# Patient Record
Sex: Male | Born: 1960 | Race: White | Hispanic: No | Marital: Married | Smoking: Never smoker
Health system: Southern US, Community
[De-identification: ages and names within clinical notes are randomized; demographics above are authoritative.]

## PROBLEM LIST (undated history)

## (undated) DIAGNOSIS — G4730 Sleep apnea, unspecified: Secondary | ICD-10-CM

## (undated) DIAGNOSIS — R748 Abnormal levels of other serum enzymes: Secondary | ICD-10-CM

## (undated) DIAGNOSIS — I10 Essential (primary) hypertension: Secondary | ICD-10-CM

## (undated) DIAGNOSIS — M1990 Unspecified osteoarthritis, unspecified site: Secondary | ICD-10-CM

## (undated) DIAGNOSIS — K219 Gastro-esophageal reflux disease without esophagitis: Secondary | ICD-10-CM

## (undated) DIAGNOSIS — K589 Irritable bowel syndrome without diarrhea: Secondary | ICD-10-CM

## (undated) DIAGNOSIS — M2241 Chondromalacia patellae, right knee: Secondary | ICD-10-CM

## (undated) DIAGNOSIS — E785 Hyperlipidemia, unspecified: Secondary | ICD-10-CM

## (undated) HISTORY — DX: Hyperlipidemia, unspecified: E78.5

## (undated) HISTORY — PX: TONSILLECTOMY AND ADENOIDECTOMY: SUR1326

## (undated) HISTORY — DX: Sleep apnea, unspecified: G47.30

## (undated) HISTORY — DX: Abnormal levels of other serum enzymes: R74.8

## (undated) HISTORY — DX: Irritable bowel syndrome, unspecified: K58.9

## (undated) HISTORY — PX: OTHER SURGICAL HISTORY: SHX169

## (undated) HISTORY — DX: Gastro-esophageal reflux disease without esophagitis: K21.9

---

## 2011-06-01 ENCOUNTER — Encounter: Payer: Self-pay | Admitting: Internal Medicine

## 2011-06-01 ENCOUNTER — Ambulatory Visit (INDEPENDENT_AMBULATORY_CARE_PROVIDER_SITE_OTHER): Payer: BC Managed Care – PPO | Admitting: Internal Medicine

## 2011-06-01 DIAGNOSIS — K589 Irritable bowel syndrome without diarrhea: Secondary | ICD-10-CM

## 2011-06-01 DIAGNOSIS — E785 Hyperlipidemia, unspecified: Secondary | ICD-10-CM | POA: Insufficient documentation

## 2011-06-01 NOTE — Patient Instructions (Addendum)
Stop Lipitor . Risk of premature heart attack or stroke increases as LDL or BAD cholesterol rises.Advanced cholesterol panels optimally determine risk based on particle composition ( NMR Lipoprofile ) or by assessing multiple other genetic risks([facility] Heart Panel 1304X). These are indicated when LDL is > 130, especially if there is family history of heart attack in males before 81 or women before 10  Please review Dr Gildardo Griffes book Eat, Drink & Be Healthy for dietary cholesterol information. Please  schedule fasting Labs  : NMR Lipoprofile Lipid Panel after 4 months of dietary changes to optimally assess LDL risk.   Please bring these instructions to that Lab appt.  Please take the probiotic , Align, every day until the bowels are normal. This will replace the normal bacteria which  are necessary for formation of normal stool and processing of food.

## 2011-06-01 NOTE — Progress Notes (Signed)
  Subjective:    Patient ID: Brendan Sharp, male    DOB: [birth year], 50 y.o.   MRN: [id number]  HPI  Mr Holloran  is here to establish primary care;acute issues  Include frequency of BMs since 03/12.      Review of Systems Dyslipidemia assessment: Prior Advanced Lipid Testing: no.   Family history of premature CAD/ MI: no .  Nutrition: no .  Exercise: 3X/ week . Diabetes : no . HTN: no. Smoking history  : never .   Weight :  stable. ROS: fatigue: no ; chest pain : no ;claudication: no; palpitations: no; abd pain/bowel changes: as above ; myalgias:no;  syncope : no ; memory loss: no;skin changes: no. Labs done 4 months ago @ annual       Objective:   Physical Exam Gen.: Thin but healthy and well-nourished in appearance. Alert, appropriate and cooperative throughout exam. Head: Normocephalic without obvious abnormalities Eyes: No corneal or conjunctival inflammation noted. Fundal exam is benign without hemorrhages, exudate, papilledema. Neck: No deformities, masses, or tenderness noted.  Thyroid normal. Lungs: Normal respiratory effort; chest expands symmetrically. Lungs are clear to auscultation without rales, wheezes, or increased work of breathing. Heart: Normal rate and rhythm. Normal S1 and S2. No gallop, click, or rub. S 4 w/o  murmur. Abdomen: Bowel sounds normal; abdomen soft and nontender. No masses, organomegaly or hernias noted.                                                                                   Musculoskeletal/extremities: No deformity or scoliosis noted of  the thoracic or lumbar spine. No clubbing, cyanosis, edema, or deformity noted. Nail health  good. Vascular: Carotid, radial artery, dorsalis pedis and  posterior tibial pulses are full and equal. No bruits present. Neurologic: Alert and oriented x3. Deep tendon reflexes symmetrical and normal.          Skin: Intact without suspicious lesions or rashes. Lymph: No cervical, axillary  lymphadenopathy  present. Psych: Mood and affect are normal. Normally interactive                                                                                        Assessment & Plan:  #1 dyslipidemia #2 possible IBS Plan: see Orders

## 2011-06-15 ENCOUNTER — Other Ambulatory Visit: Payer: Self-pay | Admitting: Internal Medicine

## 2011-06-15 ENCOUNTER — Telehealth: Payer: Self-pay | Admitting: Internal Medicine

## 2011-06-15 MED ORDER — ALIGN PO CAPS: 1.0000 | ORAL_CAPSULE | Freq: Every day | ORAL | Status: DC

## 2011-06-15 NOTE — Telephone Encounter (Signed)
RX printed and mailed 

## 2011-06-15 NOTE — Telephone Encounter (Signed)
Spoke with Pt and advised him that he is to take align, every day until the bowels are normal Per Dr hopper instruction. Also advise Pt the med is OTC so if he has used all samples he can get more med from local pharmacy. Pt ok and will call with any further concerns

## 2011-06-22 ENCOUNTER — Telehealth: Payer: Self-pay | Admitting: Internal Medicine

## 2011-06-22 NOTE — Telephone Encounter (Signed)
Patient needs rx for align - he wants to use flex spending - pleas mail to his home

## 2011-06-22 NOTE — Telephone Encounter (Signed)
Mailed on 06/15/11

## 2011-06-29 ENCOUNTER — Telehealth: Payer: Self-pay | Admitting: Internal Medicine

## 2011-06-29 MED ORDER — ALIGN PO CAPS: 1.0000 | ORAL_CAPSULE | Freq: Every day | ORAL | Status: DC

## 2011-06-29 NOTE — Telephone Encounter (Signed)
Patient said he didn't receive rx for align - i verified address which is correct - patient will pick up tomorrow

## 2011-06-29 NOTE — Telephone Encounter (Signed)
RX placed at the front for pick up

## 2011-07-20 ENCOUNTER — Telehealth: Payer: Self-pay | Admitting: Internal Medicine

## 2011-07-20 DIAGNOSIS — K589 Irritable bowel syndrome without diarrhea: Secondary | ICD-10-CM

## 2011-07-20 NOTE — Telephone Encounter (Signed)
If this supplement is not controlling the symptoms and they persist or progress; I recommended GI referral for optimal evaluation.

## 2011-07-21 NOTE — Telephone Encounter (Signed)
I recommend a GI consultation if no better with Align  to determine optimal therapy; please inform me if you have a physician preference.

## 2011-07-21 NOTE — Telephone Encounter (Signed)
Left message to call office

## 2011-07-21 NOTE — Telephone Encounter (Signed)
Addended by: Candie Echevaria L on: 07/21/2011 04:53 PM   Modules accepted: Orders

## 2011-07-21 NOTE — Telephone Encounter (Signed)
Pt aware- Referral put in--

## 2011-07-23 ENCOUNTER — Encounter: Payer: Self-pay | Admitting: Gastroenterology

## 2011-08-17 ENCOUNTER — Encounter: Payer: Self-pay | Admitting: Gastroenterology

## 2011-08-17 ENCOUNTER — Ambulatory Visit (INDEPENDENT_AMBULATORY_CARE_PROVIDER_SITE_OTHER): Payer: BC Managed Care – PPO | Admitting: Gastroenterology

## 2011-08-17 VITALS — BP 132/74 | HR 68 | Ht 70.0 in | Wt 184.0 lb

## 2011-08-17 DIAGNOSIS — R197 Diarrhea, unspecified: Secondary | ICD-10-CM | POA: Insufficient documentation

## 2011-08-17 DIAGNOSIS — Z8719 Personal history of other diseases of the digestive system: Secondary | ICD-10-CM

## 2011-08-17 DIAGNOSIS — K589 Irritable bowel syndrome without diarrhea: Secondary | ICD-10-CM

## 2011-08-17 MED ORDER — PEG-KCL-NACL-NASULF-NA ASC-C 100 G PO SOLR: 1.0000 | Freq: Once | ORAL | Status: DC

## 2011-08-17 NOTE — Progress Notes (Signed)
History of Present Illness:  This is a very nice 50 year old Caucasian male patient of Dr. Marga Melnick. He is referred today for evaluation of change in stool pattern over the last several months with frequent small volume stooling in the a.m. without melena or hematochezia. A trial of dicyclomine was unsuccessful. Also has recently discontinued Lipitor. He follows a regular diet and denies any specific food intolerances, and there is no family history of colon cancer or celiac disease. Review of labs show a normal CBC and metabolic profile, PSA level, had negative stool cards for occult blood. The patient has not had previous barium studies or colonoscopy exams. He follows a somewhat erratic diet, but denies use of sorbitol or fructose. He does occasionally have some crampy lower bowel pain, gas and bloating. He also has periodic GERD for which he uses over-the-counter Pepcid with good response. He denies dysphagia or any hepatobiliary or systemic complaints. He specifically denies Raynaud's phenomenon.  I have reviewed this patient's present history, medical and surgical past history, allergies and medications.     ROS: The remainder of the 10 point ROS is negative     Physical Exam: General well developed well nourished patient in no acute distress, appearing his stated age Eyes PERRLA, no icterus, fundoscopic exam per opthamologist Skin no lesions noted Neck supple, no adenopathy, no thyroid enlargement, no tenderness Chest clear to percussion and auscultation Heart no significant murmurs, gallops or rubs noted Abdomen no hepatosplenomegaly masses or tenderness, BS normal.  Extremities no acute joint lesions, edema, phlebitis or evidence of cellulitis. Neurologic patient oriented x 3, cranial nerves intact, no focal neurologic deficits noted. Psychological mental status normal and normal affect.  Assessment and plan: Probable diverticulosis coli with incomplete rectal emptying. I placed  him on a high fiber diet with daily Metamucil and liberal by mouth fluids. Colonoscopy has been scheduled at his convenience. I have discontinued dicyclomine use for now. Do not think he needs endoscopy for his minor GERD problems. Encounter Diagnoses  Name Primary?  . Diarrhea Yes  . Irritable bowel syndrome (IBS)   . History of gastroesophageal reflux (GERD)

## 2011-08-17 NOTE — Patient Instructions (Signed)
Your procedure has been scheduled for 09/25/2011, please follow the seperate instructions.  Handout on propofol given. Your prescription(s) have been sent to you pharmacy.  Follow the high fiber diet given. Today you watched the Diverticulitis movie in the office. Handout on diverticulitis given. Take Metamucil daily.  High Fiber Diet A high fiber diet changes your normal diet to include more whole grains, legumes, fruits, and vegetables. Changes in the diet involve replacing refined carbohydrates with unrefined foods. The calorie level of the diet is essentially unchanged. The Dietary Reference Intake (recommended amount) for adult males is 38 g per day. For adult females, it is 25 g per day. Pregnant and lactating women should consume 28 g of fiber per day. Fiber is the intact part of a plant that is not broken down during digestion. Functional fiber is fiber that has been isolated from the plant to provide a beneficial effect in the body. PURPOSE  Increase stool bulk.   Ease and regulate bowel movements.   Lower cholesterol.  INDICATIONS THAT YOU NEED MORE FIBER  Constipation and hemorrhoids.   Uncomplicated diverticulosis (intestine condition) and irritable bowel syndrome.   Weight management.   As a protective measure against hardening of the arteries (atherosclerosis), diabetes, and cancer.  NOTE OF CAUTION If you have a digestive or bowel problem, ask your caregiver for advice before adding high fiber foods to your diet. Some of the following medical problems are such that a high fiber diet should not be used without consulting your caregiver:  Acute diverticulitis (intestine infection).   Partial small bowel obstructions.   Complicated diverticular disease involving bleeding, rupture (perforation), or abscess (boil, furuncle).   Presence of autonomic neuropathy (nerve damage) or gastric paresis (stomach cannot empty itself).  GUIDELINES FOR INCREASING FIBER  Start adding  fiber to the diet slowly. A gradual increase of about 5 more grams (2 slices of whole-wheat bread, 2 servings of most fruits or vegetables, or 1 bowl of high fiber cereal) per day is best. Too rapid an increase in fiber may result in constipation, flatulence, and bloating.   Drink enough water and fluids to keep your urine clear or pale yellow. Water, juice, or caffeine-free drinks are recommended. Not drinking enough fluid may cause constipation.   Eat a variety of high fiber foods rather than one type of fiber.   Try to increase your intake of fiber through using high fiber foods rather than fiber pills or supplements that contain small amounts of fiber.   The goal is to change the types of food eaten. Do not supplement your present diet with high fiber foods, but replace foods in your present diet.  INCLUDE A VARIETY OF FIBER SOURCES  Replace refined and processed grains with whole grains, canned fruits with fresh fruits, and incorporate other fiber sources. White rice, white breads, and most bakery goods contain little or no fiber.   Brown whole-grain rice, buckwheat oats, and many fruits and vegetables are all good sources of fiber. These include: broccoli, Brussels sprouts, cabbage, cauliflower, beets, sweet potatoes, white potatoes (skin on), carrots, tomatoes, eggplant, squash, berries, fresh fruits, and dried fruits.   Cereals appear to be the richest source of fiber. Cereal fiber is found in whole grains and bran. Bran is the fiber-rich outer coat of cereal grain, which is largely removed in refining. In whole-grain cereals, the bran remains. In breakfast cereals, the largest amount of fiber is found in those with "bran" in their names. The fiber content is sometimes  indicated on the label.   You may need to include additional fruits and vegetables each day.   In baking, for 1 cup white flour, you may use the following substitutions:   1 cup whole-wheat flour minus 2 tbs.    cup  white flour plus  cup whole-wheat flour.  Document Released: 08/03/2005 Document Revised: 04/15/2011 Document Reviewed: 06/11/2009 [facility] Patient Information 2012 [place], [place].   Diverticulitis A diverticulum is a small pouch or sac on the colon. Diverticulosis is the presence of these diverticula on the colon. Diverticulitis is the irritation (inflammation) or infection of diverticula. CAUSES  The colon and its diverticula contain bacteria. If food particles block the tiny opening to a diverticulum, the bacteria inside can grow and cause an increase in pressure. This leads to infection and inflammation and is called diverticulitis. SYMPTOMS   Abdominal pain and tenderness. Usually, the pain is located on the left side of your abdomen. However, it could be located elsewhere.   Fever.   Bloating.   Feeling sick to your stomach (nausea).   Throwing up (vomiting).   Abnormal stools.  DIAGNOSIS  Your caregiver will take a history and perform a physical exam. Since many things can cause abdominal pain, other tests may be necessary. Tests may include:  Blood tests.   Urine tests.   X-ray of the abdomen.   CT scan of the abdomen.  Sometimes, surgery is needed to determine if diverticulitis or other conditions are causing your symptoms. TREATMENT  Most of the time, you can be treated without surgery. Treatment includes:  Resting the bowels by only having liquids for a few days. As you improve, you will need to eat a low-fiber diet.   Intravenous (IV) fluids if you are losing body fluids (dehydrated).   Antibiotic medicines that treat infections may be given.   Pain and nausea medicine, if needed.   Surgery if the inflamed diverticulum has burst.  HOME CARE INSTRUCTIONS   Try a clear liquid diet (broth, tea, or water for as long as directed by your caregiver). You may then gradually begin a low-fiber diet as tolerated. A low-fiber diet is a diet with less than 10 grams of  fiber. Choose the foods below to reduce fiber in the diet:   White breads, cereals, rice, and pasta.   Cooked fruits and vegetables or soft fresh fruits and vegetables without the skin.   Ground or well-cooked tender beef, ham, veal, lamb, pork, or poultry.   Eggs and seafood.   After your diverticulitis symptoms have improved, your caregiver may put you on a high-fiber diet. A high-fiber diet includes 14 grams of fiber for every 1000 calories consumed. For a standard 2000 calorie diet, you would need 28 grams of fiber. Follow these diet guidelines to help you increase the fiber in your diet. It is important to slowly increase the amount fiber in your diet to avoid gas, constipation, and bloating.   Choose whole-grain breads, cereals, pasta, and brown rice.   Choose fresh fruits and vegetables with the skin on. Do not overcook vegetables because the more vegetables are cooked, the more fiber is lost.   Choose more nuts, seeds, legumes, dried peas, beans, and lentils.   Look for food products that have greater than 3 grams of fiber per serving on the Nutrition Facts label.   Take all medicine as directed by your caregiver.   If your caregiver has given you a follow-up appointment, it is very important that you go.  Not going could result in lasting (chronic) or permanent injury, pain, and disability. If there is any problem keeping the appointment, call to reschedule.  SEEK MEDICAL CARE IF:   Your pain does not improve.   You have a hard time advancing your diet beyond clear liquids.   Your bowel movements do not return to normal.  SEEK IMMEDIATE MEDICAL CARE IF:   Your pain becomes worse.   You have an oral temperature above 102 F (38.9 C), not controlled by medicine.   You have repeated vomiting.   You have bloody or black, tarry stools.   Symptoms that brought you to your caregiver become worse or are not getting better.  MAKE SURE YOU:   Understand these instructions.     Will watch your condition.   Will get help right away if you are not doing well or get worse.  Document Released: 05/13/2005 Document Revised: 04/15/2011 Document Reviewed: 09/08/2010 [facility] Patient Information 2012 [place], [place].

## 2011-08-18 HISTORY — PX: COLONOSCOPY: SHX174

## 2011-09-09 ENCOUNTER — Other Ambulatory Visit: Payer: Self-pay | Admitting: Internal Medicine

## 2011-09-09 DIAGNOSIS — E785 Hyperlipidemia, unspecified: Secondary | ICD-10-CM

## 2011-09-10 ENCOUNTER — Other Ambulatory Visit: Payer: BC Managed Care – PPO

## 2011-09-10 DIAGNOSIS — E785 Hyperlipidemia, unspecified: Secondary | ICD-10-CM

## 2011-09-11 LAB — NMR, LIPOPROFILE

## 2011-09-18 ENCOUNTER — Telehealth: Payer: Self-pay | Admitting: *Deleted

## 2011-09-18 NOTE — Telephone Encounter (Signed)
Done 1/24 ; still pending

## 2011-09-18 NOTE — Telephone Encounter (Addendum)
Pt had labs done on 09-10-11 and would like to have results on NMR.Marland KitchenPlease advise

## 2011-09-18 NOTE — Telephone Encounter (Signed)
Discuss with patient  

## 2011-09-24 ENCOUNTER — Telehealth: Payer: Self-pay | Admitting: Internal Medicine

## 2011-09-24 NOTE — Telephone Encounter (Signed)
Office Message from [facility] Date: 09/24/2011 12:00:00 AM Time of Call: [phone] Faxed To: [facility] (Daytime Triage) Caller: Eliyohu Fax Number: [phone] Facility: N/A Patient: Brendan Sharp, Brendan Sharp DOB: 22-May-1961 Phone: [phone] Provider: Marga Melnick Message: Calling for lab results done 2 weeks ago

## 2011-09-25 ENCOUNTER — Encounter: Payer: Self-pay | Admitting: Gastroenterology

## 2011-09-25 ENCOUNTER — Ambulatory Visit (AMBULATORY_SURGERY_CENTER): Payer: BC Managed Care – PPO | Admitting: Gastroenterology

## 2011-09-25 VITALS — BP 142/78 | HR 80 | Temp 96.9°F | Resp 20 | Ht 70.0 in | Wt 184.0 lb

## 2011-09-25 DIAGNOSIS — R197 Diarrhea, unspecified: Secondary | ICD-10-CM

## 2011-09-25 DIAGNOSIS — Z1211 Encounter for screening for malignant neoplasm of colon: Secondary | ICD-10-CM | POA: Insufficient documentation

## 2011-09-25 MED ORDER — SODIUM CHLORIDE 0.9 % IV SOLN: 500.0000 mL | INTRAVENOUS | Status: DC

## 2011-09-25 NOTE — Op Note (Signed)
[facility] [address] [place], [place]  [address]  COLONOSCOPY PROCEDURE REPORT  PATIENT:  Brendan, Sharp  MR#:  [id number] BIRTHDATE:  1961-05-25, 51 yrs. old  GENDER:  male ENDOSCOPIST:  Vania Rea. Jarold Motto, MD, [facility] REF. BY:  Marga Melnick, M.D. PROCEDURE DATE:  09/25/2011 PROCEDURE:  Average-risk screening colonoscopy G0121 ASA CLASS:  Class I INDICATIONS:  Routine Risk Screening MEDICATIONS:   propofol (Diprivan) 250 mg IV  DESCRIPTION OF PROCEDURE:   After the risks and benefits and of the procedure were explained, informed consent was obtained. Digital rectal exam was performed and revealed no abnormalities. The [id number] endoscope was introduced through the anus and advanced to the cecum, which was identified by both the appendix and ileocecal valve.  The quality of the prep was excellent, using MoviPrep.  The instrument was then slowly withdrawn as the colon was fully examined. <<PROCEDUREIMAGES>>  FINDINGS:  No polyps or cancers were seen.  This was otherwise a normal examination of the colon.   Retroflexed views in the rectum revealed no abnormalities.    The scope was then withdrawn from the patient and the procedure completed.  COMPLICATIONS:  None ENDOSCOPIC IMPRESSION: 1) No polyps or cancers 2) Otherwise normal examination RECOMMENDATIONS: 1) Continue current colorectal screening recommendations for "routine risk" patients with a repeat colonoscopy in 10 years. 2) High fiber diet. 3) metamucil or benefiber  REPEAT EXAM:  No  ______________________________ Vania Rea. Jarold Motto, MD, [facility]  CC:  n. eSIGNED:   Vania Rea. Bethsaida Siegenthaler at 09/25/2011 01:58 PM  Kato, Earl Gala, [phone]

## 2011-09-25 NOTE — Telephone Encounter (Signed)
Msg from patient and he wanted the results of his NMR--nothing scanned in the chart, he would like a call back at [phone]    KP

## 2011-09-25 NOTE — Patient Instructions (Signed)
Discharge instructions given with verbal understanding.  Normal exam.  Resume previous medications. 

## 2011-09-25 NOTE — Progress Notes (Signed)
Patient did not experience any of the following events: a burn prior to discharge; a fall within the facility; wrong site/side/patient/procedure/implant event; or a hospital transfer or hospital admission upon discharge from the facility. (G8907) Patient did not have preoperative order for IV antibiotic SSI prophylaxis. (G8918)  

## 2011-09-25 NOTE — Telephone Encounter (Signed)
Patient aware results not back can take 2-3 weeks, once results in report to be mailed to patient.

## 2011-09-28 ENCOUNTER — Telehealth: Payer: Self-pay | Admitting: *Deleted

## 2011-09-28 NOTE — Telephone Encounter (Signed)
  Follow up Call-  Call back number 09/25/2011  Post procedure Call Back phone  # [phone]  Permission to leave phone message Yes     Patient questions:  Do you have a fever, pain , or abdominal swelling? no Pain Score  0 *  Have you tolerated food without any problems? yes  Have you been able to return to your normal activities? yes  Do you have any questions about your discharge instructions: Diet   no Medications  no Follow up visit  no  Do you have questions or concerns about your Care? no  Actions: * If pain score is 4 or above: No action needed, pain <4.

## 2011-09-29 ENCOUNTER — Encounter: Payer: Self-pay | Admitting: Internal Medicine

## 2011-09-29 NOTE — Telephone Encounter (Signed)
See report

## 2011-09-29 NOTE — Telephone Encounter (Signed)
Rene Kocher please verify that this was collected and the  results but are still pending

## 2011-09-30 NOTE — Telephone Encounter (Signed)
Per Dr.Hopper patient to have appointment to discuss. Spoke with patient, patient with appointment to discuss tomorrow at 11am.   **NMR is on my desk (Pink clipboard)

## 2011-10-01 ENCOUNTER — Encounter: Payer: Self-pay | Admitting: Internal Medicine

## 2011-10-01 ENCOUNTER — Ambulatory Visit (INDEPENDENT_AMBULATORY_CARE_PROVIDER_SITE_OTHER): Payer: BC Managed Care – PPO | Admitting: Internal Medicine

## 2011-10-01 ENCOUNTER — Ambulatory Visit: Payer: BC Managed Care – PPO | Admitting: Internal Medicine

## 2011-10-01 VITALS — BP 124/94 | HR 68 | Temp 97.8°F | Ht 70.0 in | Wt 183.0 lb

## 2011-10-01 DIAGNOSIS — E785 Hyperlipidemia, unspecified: Secondary | ICD-10-CM

## 2011-10-01 MED ORDER — ATORVASTATIN CALCIUM 20 MG PO TABS: ORAL_TABLET | ORAL | Status: DC

## 2011-10-01 NOTE — Patient Instructions (Signed)
Please take enteric-coated aspirin 81 mg daily with breakfast.  Please  schedule fasting Labs : CK,Lipids, hepatic panel in 10-11 weeks. PLEASE BRING THESE INSTRUCTIONS TO FOLLOW UP  LAB APPOINTMENT.This will guarantee correct labs are drawn, eliminating need for repeat blood sampling ( needle sticks ! ). Diagnoses /Codes: 272.4, 995.20

## 2011-10-01 NOTE — Progress Notes (Signed)
  Subjective:    Patient ID: Brendan Sharp, male    DOB: 15-Aug-1961, 51 y.o.   MRN: [id number]  HPI Dyslipidemia assessment: Prior Advanced Lipid Testing: NMR 09/10/2011.   Family history of premature CAD/ MI:no .  Nutrition: heart healthy .  Exercise: running 3X/ week , soccer, gym . Diabetes : no . HTN: no PMH (see today's BP) ; usually 130/80. Smoking history  : never  Meds : generic Lipitor 20 mg 1/2 pill daily ; LDL was 91, HDL 50    Lab results reviewed :Note: NMR done off Lipitor > 3 mos. LDL 146 (2030/826) with 20 % risk.LDL goal = < 100, ideally <40 HDL 40    Review of Systems Weight : stable. ROS: fatigue: no ; chest pain : no ;claudication: no; palpitations: no; abd pain/bowel changes: no ; myalgias:no;  syncope : no ; memory loss: no;skin changes:no.     Objective:   Physical Exam He appears healthy, physically fit and well-nourished; he is in no acute distress  No carotid bruits are present.  Heart rhythm and rate are normal with no significant murmurs or gallops S 4.  Chest is clear with no increased work of breathing  There is no evidence of aortic aneurysm or renal artery bruits  He has no clubbing or edema.   Pedal pulses are intact   No ischemic skin changes are present         Assessment & Plan:

## 2011-10-01 NOTE — Assessment & Plan Note (Signed)
The advanced cholesterol testing revealed a 20% increase risk of the generic Lipitor. Atorvastatin dose of 10 mg daily got him to goal of less than 100. Surprisingly his HDL is higher on the atorvastatin than off; this may be related to his level exercise during those periods.  Medication will be reviewed an 81 mg of aspirin daily recommended. Lipids should be monitored annually.

## 2011-10-19 ENCOUNTER — Encounter: Payer: Self-pay | Admitting: *Deleted

## 2011-10-23 ENCOUNTER — Ambulatory Visit (INDEPENDENT_AMBULATORY_CARE_PROVIDER_SITE_OTHER): Payer: BC Managed Care – PPO | Admitting: Gastroenterology

## 2011-10-23 ENCOUNTER — Other Ambulatory Visit (INDEPENDENT_AMBULATORY_CARE_PROVIDER_SITE_OTHER): Payer: BC Managed Care – PPO

## 2011-10-23 ENCOUNTER — Encounter: Payer: Self-pay | Admitting: Gastroenterology

## 2011-10-23 VITALS — BP 128/84 | HR 72 | Ht 70.0 in | Wt 181.6 lb

## 2011-10-23 DIAGNOSIS — R197 Diarrhea, unspecified: Secondary | ICD-10-CM

## 2011-10-23 DIAGNOSIS — R748 Abnormal levels of other serum enzymes: Secondary | ICD-10-CM

## 2011-10-23 DIAGNOSIS — K589 Irritable bowel syndrome without diarrhea: Secondary | ICD-10-CM

## 2011-10-23 HISTORY — DX: Abnormal levels of other serum enzymes: R74.8

## 2011-10-23 LAB — BASIC METABOLIC PANEL
CO2: 28 mEq/L (ref 19–32)
Calcium: 9.3 mg/dL (ref 8.4–10.5)
GFR: 86.34 mL/min (ref >60.00)
Potassium: 4.1 mEq/L (ref 3.5–5.1)
Sodium: 138 mEq/L (ref 135–145)

## 2011-10-23 LAB — SEDIMENTATION RATE: Sed Rate: 10 mm/hr (ref 0–22)

## 2011-10-23 LAB — HEPATIC FUNCTION PANEL
AST: 24 U/L (ref 0–37)
Alkaline Phosphatase: 37 U/L — ABNORMAL LOW (ref 39–117)
Bilirubin, Direct: 0.1 mg/dL (ref 0.0–0.3)

## 2011-10-23 LAB — CBC WITH DIFFERENTIAL/PLATELET
Eosinophils Relative: 1.9 % (ref 0.0–5.0)
Monocytes Relative: 7.2 % (ref 3.0–12.0)
Neutrophils Relative %: 45.6 % (ref 43.0–77.0)
Platelets: 210 10*3/uL (ref 150.0–400.0)
WBC: 5.2 10*3/uL (ref 4.5–10.5)

## 2011-10-23 LAB — IBC PANEL
Iron: 85 ug/dL (ref 42–165)
Saturation Ratios: 23 % (ref 20.0–50.0)

## 2011-10-23 LAB — FERRITIN: Ferritin: 31.5 ng/mL (ref 22.0–322.0)

## 2011-10-23 NOTE — Patient Instructions (Signed)
Your physician has requested that you go to the basement for  lab work before leaving today  Please refer to the diet information we have given you.

## 2011-10-23 NOTE — Progress Notes (Signed)
History of Present Illness: This is a 51 year old Caucasian male with 2-3 soft bowel movements in the morning without other GI complaints except for mild gas and bloating. Recent colonoscopy showed diverticulosis, but otherwise was unremarkable. Trial of a high fiber diet with daily Metamucil has not helped his complaints. Her main concern that his bowels have changed over the last year without any real changes in his diet  although he has moved from [place] to the [place]. He gives a vague history of having possibly had GERD in the past.   Current Medications, Allergies, Past Medical History, Past Surgical History, Family History and Social History were reviewed in [employer] record.   Assessment and plan: Probable IBS. I've ordered stool O&P exam also stool fat exam, and lab data for review including celiac panel. I have placed him on Align probiotic therapy and a trial of a FODMAP diet for IBS.

## 2011-10-24 LAB — URINALYSIS, MICROSCOPIC ONLY
Casts: NONE SEEN
Squamous Epithelial / LPF: NONE SEEN

## 2011-10-26 ENCOUNTER — Other Ambulatory Visit: Payer: BC Managed Care – PPO

## 2011-10-26 DIAGNOSIS — R197 Diarrhea, unspecified: Secondary | ICD-10-CM

## 2011-10-26 LAB — CELIAC PANEL 10
Endomysial Screen: NEGATIVE
Tissue Transglutaminase Ab, IgA: 2.6 U/mL (ref <20)

## 2011-10-26 LAB — CAROTENE, SERUM: Carotene, Total-Serum: 23 ug/dL (ref 4–51)

## 2011-10-27 LAB — FECAL FAT QUALITATIVE
Free Fatty Acids: NORMAL
NEUTRAL FAT: NORMAL

## 2011-10-30 ENCOUNTER — Telehealth: Payer: Self-pay | Admitting: Gastroenterology

## 2011-10-30 MED ORDER — METRONIDAZOLE 250 MG PO TABS: 250.0000 mg | ORAL_TABLET | Freq: Three times a day (TID) | ORAL | Status: AC

## 2011-10-30 NOTE — Telephone Encounter (Signed)
Pt seen 08/17/11 for 3-4 loose stools daily with abdominal pain in the am. Dicyclomine didn't help and pt was placed on a high fiber diet and was to increase his po fluids and add metamucil or benefiber to his diet. COLON 09/24/10 that was normal except for diverticulosis, continue diet metamucil.. 10/23/11 OV for continues loose stools and increased urination in am. Assessment was probable IBS and labs were ordered, Align was added, labs were ordered and pt placed on FODMAP diet. Pt called today requesting his lab results; he reports his s&s remain the same. Please advise. Thanks.

## 2011-10-30 NOTE — Telephone Encounter (Signed)
Informed pt of Dr Norval Gable recommendation for flagyl. Pt stated understanding and requested med be sent to [facility]. Pt also requested a copy of his labs and I mailed then to his home.

## 2011-10-30 NOTE — Telephone Encounter (Signed)
Try metronidazole 250 mg tid for 10 days

## 2011-11-02 ENCOUNTER — Telehealth: Payer: Self-pay | Admitting: *Deleted

## 2011-11-02 NOTE — Telephone Encounter (Signed)
Pt read all the side effects for Metronidazole and that it's an antibiotic and he wonders if anything is wrong with him. Explained to pt his stool tests were negative, his COLON was normal and d/t his c/o diarrhea and cramping, Dr Jarold Motto placed him on the drug to see if his symptoms improved. Pt stated understanding.

## 2011-12-01 ENCOUNTER — Encounter: Payer: Self-pay | Admitting: Internal Medicine

## 2011-12-01 ENCOUNTER — Ambulatory Visit (INDEPENDENT_AMBULATORY_CARE_PROVIDER_SITE_OTHER): Payer: BC Managed Care – PPO | Admitting: Internal Medicine

## 2011-12-01 VITALS — BP 124/80 | HR 56 | Temp 98.4°F | Resp 12 | Ht 70.5 in | Wt 182.0 lb

## 2011-12-01 DIAGNOSIS — R9431 Abnormal electrocardiogram [ECG] [EKG]: Secondary | ICD-10-CM

## 2011-12-01 DIAGNOSIS — Z0000 Encounter for general adult medical examination without abnormal findings: Secondary | ICD-10-CM

## 2011-12-01 NOTE — Progress Notes (Signed)
  Subjective:    Patient ID: Brendan Sharp, male    DOB: 07-Dec-1960, 51 y.o.   MRN: [id number]  HPI  Mr. Istre  is here for a physical;acute issues include IBS      Review of Systems  His  irritable bowel symptoms persist, manifested as before loose bowel movements in the morning. He has tried to restrict both lactose and gluten without response. Colonoscopy was performed in February this year by Dr. Jarold Motto; it was normal. There was no response to probiotics or to a course of metronidazole He denies any significant reflux symptoms. Specifically he has no dysphagia, significant dyspepsia, or abdominal pain.     Objective:   Physical Exam Gen.: Thin but healthy and well-nourished in appearance. Alert, appropriate and cooperative throughout exam. Head: Normocephalic without obvious abnormalities;  no alopecia  Eyes: No corneal or conjunctival inflammation noted. Pupils equal round reactive to light and accommodation. Fundal exam is benign without hemorrhages, exudate, papilledema. Extraocular motion intact. Vision grossly normal. Ears: External  ear exam reveals no significant lesions or deformities. Canals clear .TMs normal. Hearing is grossly normal bilaterally. Nose: External nasal exam reveals no deformity or inflammation. Nasal mucosa are pink and moist. No lesions or exudates noted. Septum  : minimally dislocated  Mouth: Oral mucosa and oropharynx reveal no lesions or exudates. Teeth in good repair. Neck: No deformities, masses, or tenderness noted. Range of motion & Thyroid normal. Lungs: Normal respiratory effort; chest expands symmetrically. Lungs are clear to auscultation without rales, wheezes, or increased work of breathing. Heart: Normal rate and rhythm. Normal S1 and S2. No gallop, click, or rub. S4 w/o  murmur. Abdomen: Bowel sounds normal; abdomen soft and nontender. No masses, organomegaly or hernias noted. Genitalia: Some atrophy L testicle.Note : Colonoscopy  2/13 Musculoskeletal/extremities: No deformity or scoliosis noted of  the thoracic or lumbar spine. No clubbing, cyanosis, edema noted. Range of motion  normal .Tone & strength  normal.Joints normal. Nail health  Good.Minor flexion contracture 5 th R finger Vascular: Carotid, radial artery, dorsalis pedis and  posterior tibial pulses are full and equal. No bruits present. Neurologic: Alert and oriented x3. Deep tendon reflexes symmetrical and normal.          Skin: Intact without suspicious lesions or rashes. Lymph: No cervical, axillary, or inguinal lymphadenopathy present. Psych: Mood and affect are normal. Normally interactive                                                                                         Assessment & Plan:  #1 comprehensive physical exam; no acute findings #2 see Problem List with Assessments & Recommendations Plan: see Orders

## 2011-12-01 NOTE — Assessment & Plan Note (Signed)
He is very physically active playing soccer with no symptoms of chest pain or palpitations.

## 2011-12-01 NOTE — Patient Instructions (Signed)
Please  schedule fasting Labs 4/25 : Lipids, AST/ALT. PLEASE BRING THESE INSTRUCTIONS TO FOLLOW UP  LAB APPOINTMENT.This will guarantee correct labs are drawn, eliminating need for repeat blood sampling ( needle sticks ! ). Diagnoses /Codes: 272.4, 995.20 Exercise at least 30-45 minutes a day,  3-4 days a week.  Eat a low-fat diet with lots of fruits and vegetables, up to 7-9 servings per day. Consume less than 40 grams of sugar per day from foods & drinks with High Fructose Corn Sugar as # 1,2,3 or # 4 on label. There is  minimal reduction in Alkaline Phosphatase.Dietary sources of  Alk Phos include whole grains & nuts. Alk Phos is important for optimal liver & bone health.

## 2011-12-10 ENCOUNTER — Other Ambulatory Visit (INDEPENDENT_AMBULATORY_CARE_PROVIDER_SITE_OTHER): Payer: BC Managed Care – PPO

## 2011-12-10 DIAGNOSIS — E785 Hyperlipidemia, unspecified: Secondary | ICD-10-CM

## 2011-12-10 DIAGNOSIS — T887XXA Unspecified adverse effect of drug or medicament, initial encounter: Secondary | ICD-10-CM

## 2011-12-10 LAB — HEPATIC FUNCTION PANEL
ALT: 29 U/L (ref 0–53)
Bilirubin, Direct: 0.1 mg/dL (ref 0.0–0.3)
Total Bilirubin: 0.7 mg/dL (ref 0.3–1.2)

## 2011-12-10 LAB — LIPID PANEL
HDL: 53.6 mg/dL (ref >39.00)
Total CHOL/HDL Ratio: 3
VLDL: 15.4 mg/dL (ref 0.0–40.0)

## 2011-12-10 LAB — AST: AST: 23 U/L (ref 0–37)

## 2011-12-10 LAB — ALT: ALT: 29 U/L (ref 0–53)

## 2011-12-10 LAB — CK: Total CK: 200 U/L (ref 7–232)

## 2011-12-10 NOTE — Progress Notes (Signed)
Only labs  

## 2012-03-07 ENCOUNTER — Encounter: Payer: Self-pay | Admitting: Internal Medicine

## 2012-03-07 ENCOUNTER — Ambulatory Visit (INDEPENDENT_AMBULATORY_CARE_PROVIDER_SITE_OTHER): Payer: BC Managed Care – PPO | Admitting: Internal Medicine

## 2012-03-07 VITALS — BP 126/80 | HR 57 | Temp 97.8°F | Wt 183.0 lb

## 2012-03-07 DIAGNOSIS — W57XXXA Bitten or stung by nonvenomous insect and other nonvenomous arthropods, initial encounter: Secondary | ICD-10-CM

## 2012-03-07 DIAGNOSIS — T148XXA Other injury of unspecified body region, initial encounter: Secondary | ICD-10-CM

## 2012-03-07 DIAGNOSIS — M7540 Impingement syndrome of unspecified shoulder: Secondary | ICD-10-CM

## 2012-03-07 DIAGNOSIS — M25819 Other specified joint disorders, unspecified shoulder: Secondary | ICD-10-CM

## 2012-03-07 DIAGNOSIS — T148 Other injury of unspecified body region: Secondary | ICD-10-CM

## 2012-03-07 NOTE — Progress Notes (Signed)
  Subjective:    Patient ID: Brendan Sharp, male    DOB: 1960-10-25, 51 y.o.   MRN: [id number]  HPI He was bitten or stung by a flying insect 03/11/12 approximately 4 PM on the right cheek and right back while he was weeding. He applied ice and topical steroid; lesions had not progressed.  He's had intermittent pain in the right shoulder for over a month. There was no specific trigger, overuse or injury. The pain is nonradiating; elevation can aggravate symptoms. He had partial response to topical heat and nonsteroidals    Review of Systems He denies any associated fever, chills, sweats, wheezing, or angioedema symptoms. Neck Pain: no Numbness/tingling:  no Weakness: no Bowel/bladder dysfunction:  no       Objective:   Physical Exam  He appears healthy and well nourished; he is in no distress  He has no lymphadenopathy about the neck or axilla  There is a small hematoma over the right cheek with localized edema. There is minimal erythema on the right upper, posterior thorax without cellulitis  Deep tendon reflexes , strength and tone are normal.  He has full range of motion of the upper extremities. There is minimal discomfort with rotation. There is a mild flexion contraction of the right fifth digit        Assessment & Plan:  #1 insect bite right cheek right thorax; no associated complications. Warm compresses recommended to the right cheek  #2 mild shoulder impingement syndrome on the right without evidence of rotator cuff tear/injury  Plan: See orders&  recommendations

## 2012-03-07 NOTE — Patient Instructions (Addendum)
Use an anti-inflammatory cream such as Aspercreme or Zostrix cream twice a day to the shoulder as needed. In lieu of this warm moist compresses or  hot water bottle can be used. Do not apply ice . Aleve one to 2 every 8-12 hours with food as needed.

## 2012-10-01 ENCOUNTER — Other Ambulatory Visit: Payer: Self-pay

## 2012-10-11 ENCOUNTER — Encounter: Payer: Self-pay | Admitting: Internal Medicine

## 2012-10-11 ENCOUNTER — Ambulatory Visit (INDEPENDENT_AMBULATORY_CARE_PROVIDER_SITE_OTHER): Payer: BC Managed Care – PPO | Admitting: Internal Medicine

## 2012-10-11 VITALS — BP 122/84 | HR 60 | Temp 98.2°F | Wt 186.0 lb

## 2012-10-11 DIAGNOSIS — R1031 Right lower quadrant pain: Secondary | ICD-10-CM

## 2012-10-11 DIAGNOSIS — R109 Unspecified abdominal pain: Secondary | ICD-10-CM

## 2012-10-11 DIAGNOSIS — R1032 Left lower quadrant pain: Secondary | ICD-10-CM

## 2012-10-11 LAB — POCT URINALYSIS DIPSTICK
Bilirubin, UA: NEGATIVE
Blood, UA: NEGATIVE
Glucose, UA: NEGATIVE
Leukocytes, UA: NEGATIVE
Nitrite, UA: NEGATIVE
Urobilinogen, UA: 0.2

## 2012-10-11 NOTE — Patient Instructions (Addendum)
The best exercises for stretching the inguinal ligaments include Cybex & Nautilus .Consider glucosamine sulfate 1500 mg daily for joint symptoms. Take this daily  for 3 months and then leave it off for 2 months. This will rehydrate the soft tissues.

## 2012-10-11 NOTE — Progress Notes (Signed)
  Subjective:    Patient ID: Brendan Sharp, male    DOB: Jan 08, 1961, 52 y.o.   MRN: [id number]  HPI Abdominal pain began November 2013 in both inguinal areas after he began  running &  training for soccer  .It was described as  cramping  and non radiating . Severity was up to a level 5  ; the discomfort lasted as long as he is running. It was not impacted by change in position,urination ,or  bowel function. No self treatment to date . There were no other  definite exacerbating factors.   Past medical history is positive for IBS. Family history negative  for significant gastrointestinal diseases such as ulcer, reflux,  colitis,  colon polyps, or colon cancer.         Review of Systems Nausea vomiting, constipation , diarrhea, melena or rectal bleeding were not described. There was no associated dyspepsia ,dysphagia, anorexia or hematemesis. No significant weight change noted. Fever,chills,  & sweats were not present. Dysuria, pyuria, and hematuria were absent. There was no associated rash or radicular pain in the area of the discomfort.     Objective:   Physical Exam Gen.: Healthy and well-nourished in appearance. Alert, appropriate and cooperative throughout exam.Appears younger than stated age  Eyes: No corneal or conjunctival inflammation noted. No icterus Mouth: Oral mucosa and oropharynx reveal no lesions or exudates. Teeth in good repair. Neck: No deformities, masses, or tenderness noted. Range of motion normal. Abdomen: Bowel sounds normal; abdomen soft and nontender. No masses, organomegaly or hernias noted. Genitalia: Genitalia normal except for smaller L testicle . No hernia                                Musculoskeletal/extremities: No deformity or scoliosis noted of  the thoracic or lumbar spine.  No clubbing, cyanosis, edema, or significant extremity  deformity noted. Range of motion normal .Tone & strength  Normal. Joints normal. Nail health good. Able to lie down & sit up  w/o help. Negative SLR bilaterally Vascular:  dorsalis pedis and  posterior tibial pulses are full and equal. No bruits present. Neurologic: Alert and oriented x3. Deep tendon reflexes symmetrical and normal.  Gait normal  including heel & toe walking .        Skin: Intact without suspicious lesions or rashes. Lymph: No cervical, axillary, or inguinal lymphadenopathy present. Psych: Mood and affect are normal. Normally interactive                                                                                        Assessment & Plan:  #1 bilateral inguinal pain with running or playing soccer; this suggests soft tissue etiology. Range of motion, strength, tone, deep tendon reflexes are excellent. There is no clinical evidence of a  Radiculopathy.  Plan: Prophylactic stretching exercises 3 times a week and a trial of glucosamine. Sports medicine referral if symptoms persist or progress

## 2012-11-11 ENCOUNTER — Telehealth: Payer: Self-pay | Admitting: Internal Medicine

## 2012-11-11 DIAGNOSIS — E785 Hyperlipidemia, unspecified: Secondary | ICD-10-CM

## 2012-11-11 MED ORDER — ATORVASTATIN CALCIUM 20 MG PO TABS: ORAL_TABLET | ORAL | Status: DC

## 2012-11-11 NOTE — Telephone Encounter (Signed)
REFILLS NEEDED ON ATORVASTATIN 20 MG TAB #45 SIG: TAKE ONE -HALF TABLET BY MOUTH EVERY DAY  LAST FILLED :12.07.2013

## 2012-11-12 ENCOUNTER — Other Ambulatory Visit: Payer: Self-pay | Admitting: Internal Medicine

## 2012-12-23 ENCOUNTER — Encounter: Payer: Self-pay | Admitting: Internal Medicine

## 2012-12-23 ENCOUNTER — Ambulatory Visit (INDEPENDENT_AMBULATORY_CARE_PROVIDER_SITE_OTHER): Payer: BC Managed Care – PPO | Admitting: Internal Medicine

## 2012-12-23 VITALS — BP 122/78 | HR 65 | Temp 98.0°F | Resp 14 | Ht 70.03 in | Wt 183.0 lb

## 2012-12-23 DIAGNOSIS — E785 Hyperlipidemia, unspecified: Secondary | ICD-10-CM

## 2012-12-23 DIAGNOSIS — Z0000 Encounter for general adult medical examination without abnormal findings: Secondary | ICD-10-CM

## 2012-12-23 DIAGNOSIS — R9431 Abnormal electrocardiogram [ECG] [EKG]: Secondary | ICD-10-CM

## 2012-12-23 DIAGNOSIS — Z23 Encounter for immunization: Secondary | ICD-10-CM

## 2012-12-23 LAB — POCT URINALYSIS DIPSTICK
Bilirubin, UA: NEGATIVE
Blood, UA: NEGATIVE
Ketones, UA: NEGATIVE
Spec Grav, UA: 1.01
pH, UA: 6

## 2012-12-23 MED ORDER — ATORVASTATIN CALCIUM 10 MG PO TABS: ORAL_TABLET | ORAL | Status: DC

## 2012-12-23 NOTE — Progress Notes (Signed)
  Subjective:    Patient ID: Brendan Sharp, male    DOB: 01-27-1961, 52 y.o.   MRN: [id number]  HPI  He is here for a physical;acute issues include frequency in am intermittently.     Review of Systems He is on a heart healthy diet; he exercises as walking , running & soccer  30-45 minutes 3 times per week without symptoms. Specifically he denies chest pain, palpitations, dyspnea, or claudication. Family history is negative for premature coronary disease but strongly positive for hyperlipidemia. Advanced cholesterol testing reveals his LDL goal is less than 100, ideally < 70..     Objective:   Physical Exam Gen.: Thin but healthy and well-nourished in appearance. Alert, appropriate and cooperative throughout exam.Appears younger than stated age  Head: Normocephalic without obvious abnormalities; no alopecia  Eyes: No corneal or conjunctival inflammation noted. Pupils equal round reactive to light and accommodation.  Extraocular motion intact. Vision grossly normal without lenses Ears: External  ear exam reveals no significant lesions or deformities. Canals clear .TMs normal. Hearing is grossly normal bilaterally. Nose: External nasal exam reveals no deformity or inflammation. Nasal mucosa are pink and moist. No lesions or exudates noted.   Mouth: Oral mucosa and oropharynx reveal no lesions or exudates. Teeth in good repair. Neck: No deformities, masses, or tenderness noted. Range of motion & Thyroid normal. Lungs: Normal respiratory effort; chest expands symmetrically. Lungs are clear to auscultation without rales, wheezes, or increased work of breathing. Heart: Normal rate and rhythm. Normal S1 and S2. No gallop, or rub. Click w/o murmur @ apex. Abdomen: Bowel sounds normal; abdomen soft and nontender. No masses, organomegaly or hernias noted. Genitalia: Genitalia normal except for small left testicle & varices. Prostate is normal without enlargement, asymmetry, nodularity, or induration.                                Musculoskeletal/extremities: No deformity or scoliosis noted of  the thoracic or lumbar spine.  No clubbing, cyanosis, edema, or significant extremity  deformity noted. Range of motion normal .Tone & strength  Normal. Joints normal . Nail health good. Able to lie down & sit up w/o help. Negative SLR bilaterally Vascular: Carotid, radial artery, dorsalis pedis and  posterior tibial pulses are full and equal. No bruits present. Neurologic: Alert and oriented x3. Deep tendon reflexes symmetrical and normal.         Skin: Intact without suspicious lesions or rashes. Lymph: No cervical, axillary, or inguinal lymphadenopathy present. Psych: Mood and affect are normal. Normally interactive                                                                                        Assessment & Plan:  #1 comprehensive physical exam; no acute findings #2 intermittent am frequency  Plan: see Orders  & Recommendations

## 2012-12-23 NOTE — Patient Instructions (Addendum)
Please  schedule fasting Labs : BMET,Lipids, hepatic panel, CBC & dif, TSH.PLEASE BRING THESE INSTRUCTIONS TO FOLLOW UP  LAB APPOINTMENT.This will guarantee correct labs are drawn, eliminating need for repeat blood sampling ( needle sticks ! ). Diagnoses /Codes: V70.0 

## 2012-12-26 ENCOUNTER — Telehealth: Payer: Self-pay | Admitting: Internal Medicine

## 2012-12-26 ENCOUNTER — Other Ambulatory Visit: Payer: Self-pay | Admitting: General Practice

## 2012-12-26 DIAGNOSIS — E785 Hyperlipidemia, unspecified: Secondary | ICD-10-CM

## 2012-12-26 NOTE — Telephone Encounter (Signed)
[facility]- he would like rx sent to them- I have put the from in the box beside the fax machine for someone

## 2012-12-26 NOTE — Telephone Encounter (Signed)
Waiting on Pt labs on 5/15 before atorvastatin can be filled. Pt has not had labs since 11/2011.

## 2012-12-26 NOTE — Telephone Encounter (Signed)
Will check tomorrow to see if we have any Lipitor samples.

## 2012-12-26 NOTE — Telephone Encounter (Signed)
Called pt to leave a message to verify what meds pt needs filled.

## 2012-12-27 NOTE — Telephone Encounter (Signed)
No samples. However med shows it was filled on 5/9 #90 with 3 refills.

## 2012-12-29 ENCOUNTER — Other Ambulatory Visit (INDEPENDENT_AMBULATORY_CARE_PROVIDER_SITE_OTHER): Payer: BC Managed Care – PPO

## 2012-12-29 DIAGNOSIS — Z0000 Encounter for general adult medical examination without abnormal findings: Secondary | ICD-10-CM

## 2012-12-29 LAB — HEPATIC FUNCTION PANEL
Alkaline Phosphatase: 45 U/L (ref 39–117)
Bilirubin, Direct: 0 mg/dL (ref 0.0–0.3)

## 2012-12-29 LAB — CBC WITH DIFFERENTIAL/PLATELET
Basophils Absolute: 0.1 10*3/uL (ref 0.0–0.1)
Basophils Relative: 1.2 % (ref 0.0–3.0)
Eosinophils Absolute: 0.3 10*3/uL (ref 0.0–0.7)
HCT: 45.6 % (ref 39.0–52.0)
Hemoglobin: 15.6 g/dL (ref 13.0–17.0)
Lymphs Abs: 2.4 10*3/uL (ref 0.7–4.0)
MCHC: 34.2 g/dL (ref 30.0–36.0)
Monocytes Relative: 9 % (ref 3.0–12.0)
Neutro Abs: 2.2 10*3/uL (ref 1.4–7.7)
RBC: 4.84 Mil/uL (ref 4.22–5.81)
RDW: 12.2 % (ref 11.5–14.6)

## 2012-12-29 LAB — BASIC METABOLIC PANEL
BUN: 18 mg/dL (ref 6–23)
CO2: 27 mEq/L (ref 19–32)
Chloride: 104 mEq/L (ref 96–112)
Creatinine, Ser: 1 mg/dL (ref 0.4–1.5)
Glucose, Bld: 85 mg/dL (ref 70–99)

## 2012-12-29 LAB — LIPID PANEL
LDL Cholesterol: 102 mg/dL — ABNORMAL HIGH (ref 0–99)
Total CHOL/HDL Ratio: 4

## 2013-01-05 MED ORDER — ATORVASTATIN CALCIUM 10 MG PO TABS: ORAL_TABLET | ORAL | Status: DC

## 2013-01-05 NOTE — Telephone Encounter (Signed)
Patient states he needs his atorvastatin 10 mg sent to [facility] fax [phone]. We sent this to [facility], but that is not where he wants it filled.

## 2013-01-05 NOTE — Telephone Encounter (Signed)
Rx sent to pharmacy, left pt detail message. Rx from 12-23-12 was printed and not sent in to pharmacy Pt was here for OV so Rx was possibly given to Pt.

## 2013-06-22 ENCOUNTER — Other Ambulatory Visit: Payer: Self-pay

## 2013-09-25 ENCOUNTER — Other Ambulatory Visit: Payer: Self-pay | Admitting: Internal Medicine

## 2013-09-25 ENCOUNTER — Ambulatory Visit (INDEPENDENT_AMBULATORY_CARE_PROVIDER_SITE_OTHER): Payer: BC Managed Care – PPO | Admitting: Internal Medicine

## 2013-09-25 ENCOUNTER — Encounter: Payer: Self-pay | Admitting: Internal Medicine

## 2013-09-25 VITALS — BP 130/88 | HR 71 | Temp 98.4°F | Resp 12 | Wt 185.2 lb

## 2013-09-25 DIAGNOSIS — J209 Acute bronchitis, unspecified: Secondary | ICD-10-CM

## 2013-09-25 MED ORDER — HYDROCODONE-HOMATROPINE 5-1.5 MG/5ML PO SYRP: 5.0000 mL | ORAL_SOLUTION | Freq: Four times a day (QID) | ORAL | Status: DC | PRN

## 2013-09-25 MED ORDER — AZITHROMYCIN 250 MG PO TABS: ORAL_TABLET | ORAL | Status: DC

## 2013-09-25 NOTE — Progress Notes (Signed)
Pre visit review using our clinic review tool, if applicable. No additional management support is needed unless otherwise documented below in the visit note. 

## 2013-09-25 NOTE — Progress Notes (Signed)
   Subjective:    Patient ID: Brendan Sharp, male    DOB: 03-Aug-1961, 53 y.o.   MRN: [id number]  HPI  Symptoms began 09/18/13 as chills and malaise. This was also associated subsequently with itchy, watery eyes. He did have some sort throat. Tylenol and Allegra D were of some benefit  He has developed a cough which is mainly nonproductive.   He also describes some fever and sweats with chills  Rhinitis has been characterized by mainly clear secretions. He said some blood from the left nare      Review of Systems The cough had not  been associated shortness of breath or wheezing. He specifically denies frontal sinus pain, facial pain, significant nasal purulence, dental pain, otic pain, otic discharge.      Objective:   Physical Exam  General appearance:good health ;well nourished; no acute distress or increased work of breathing is present.  No  lymphadenopathy about the head, neck, or axilla noted.   Eyes: No conjunctival inflammation or lid edema is present.   Ears:  External ear exam shows no significant lesions or deformities.  Otoscopic examination reveals clear canals, tympanic membranes are intact bilaterally without bulging, retraction, inflammation or discharge.  Nose:  External nasal examination shows no deformity or inflammation. Nasal mucosa are dry without lesions or exudates. No septal dislocation or deviation.No obstruction to airflow.   Oral exam: Dental hygiene is good; lips and gums are healthy appearing.There is no oropharyngeal erythema or exudate noted.   Neck:  No deformities,  masses, or tenderness noted.   Supple with full range of motion without pain.   Heart:  Normal rate and regular rhythm. S1 and S2 normal without gallop, murmur, click, rub or other extra sounds.   Lungs:Chest clear to auscultation; no wheezes, rhonchi,rales ,or rubs present.No increased work of breathing.    Extremities:  No cyanosis, edema, or clubbing  noted    Skin: Warm & dry  w/o jaundice or tenting.         Assessment & Plan:  #1 acute bronchitis w/o bronchospasm #2 rhinitis Plan: See orders and recommendations

## 2013-09-25 NOTE — Patient Instructions (Signed)

## 2013-12-28 ENCOUNTER — Ambulatory Visit (INDEPENDENT_AMBULATORY_CARE_PROVIDER_SITE_OTHER): Payer: BC Managed Care – PPO | Admitting: Internal Medicine

## 2013-12-28 ENCOUNTER — Encounter: Payer: Self-pay | Admitting: Internal Medicine

## 2013-12-28 VITALS — BP 128/80 | HR 68 | Temp 97.7°F | Ht 70.0 in | Wt 182.2 lb

## 2013-12-28 DIAGNOSIS — R0609 Other forms of dyspnea: Secondary | ICD-10-CM

## 2013-12-28 DIAGNOSIS — R0989 Other specified symptoms and signs involving the circulatory and respiratory systems: Secondary | ICD-10-CM

## 2013-12-28 DIAGNOSIS — Z0000 Encounter for general adult medical examination without abnormal findings: Secondary | ICD-10-CM

## 2013-12-28 DIAGNOSIS — E785 Hyperlipidemia, unspecified: Secondary | ICD-10-CM

## 2013-12-28 NOTE — Progress Notes (Signed)
   Subjective:    Patient ID: Brendan Sharp, male    DOB: Oct 30, 1960, 53 y.o.   MRN: [id number]  HPI   He is here for a physical;acute issues include DOE.  A heart healthy diet is followed; exercise encompasses 30-90 minutes 7  times per week as  Running or soccer. Family history is negative for premature coronary disease. Advanced cholesterol testing reveals  LDL goal is less than 100 ; ideally < 70 . The statin was D/Ced 10/15/13.  Low dose ASA taken    Review of Systems Specifically denied are  chest pain, palpitations,  or claudication.  Significant abdominal symptoms, memory deficit, or myalgias not present. No unexplained weight loss, abdominal pain, significant dyspepsia, dysphagia, melena, rectal bleeding, or persistently small caliber stools.      Objective:   Physical Exam Gen.: Healthy and well-nourished in appearance. Alert, appropriate and cooperative throughout exam. Appears younger than stated age  Head: Normocephalic without obvious abnormalities;  no alopecia  Eyes: No corneal or conjunctival inflammation noted. Pupils equal round reactive to light and accommodation. Extraocular motion intact. Ears: External  ear exam reveals no significant lesions or deformities. Canals clear .TMs normal. Hearing is grossly normal bilaterally. Nose: External nasal exam reveals no deformity or inflammation. Nasal mucosa are pink and moist. No lesions or exudates noted.   Mouth: Oral mucosa and oropharynx reveal no lesions or exudates. Teeth in good repair. Neck: No deformities, masses, or tenderness noted. Range of motion & Thyroid normal. Lungs: Normal respiratory effort; chest expands symmetrically. Lungs are clear to auscultation without rales, wheezes, or increased work of breathing. Heart: Normal rate and rhythm. Accentuated  S1. Normal  S2. No gallop, click, or rub. No murmur. Abdomen: Bowel sounds normal; abdomen soft and nontender. No masses, organomegaly or hernias  noted. Genitalia: Genitalia normal except for left varices & slightly atrophic L testicle. Prostate is normal without enlargement, asymmetry, nodularity, or induration                                     Musculoskeletal/extremities: No deformity or scoliosis noted of  the thoracic or lumbar spine.   No clubbing, cyanosis, edema, or significant extremity  deformity noted. Range of motion normal .Tone & strength normal. Hand joints normal except flexion R 5 th finger. Pes planus. Fingernail health good. Able to lie down & sit up w/o help. Negative SLR bilaterally Vascular: Carotid, radial artery, dorsalis pedis and  posterior tibial pulses are full and equal. No bruits present. Neurologic: Alert and oriented x3. Deep tendon reflexes symmetrical and normal.  Gait normal . Skin: Intact without suspicious lesions or rashes. Lymph: No cervical, axillary, or inguinal lymphadenopathy present. Psych: Mood and affect are normal. Normally interactive                                                                                        Assessment & Plan:  #1 comprehensive physical exam; no acute findings #2 DOE  Plan: see Orders  & Recommendations

## 2013-12-28 NOTE — Patient Instructions (Addendum)
Your next office appointment will be determined based upon review of your pending labs. Those instructions will be transmitted to you through My Chart . Minimal Blood Pressure Goal= AVERAGE < 140/90;  Ideal is an AVERAGE < 135/85. This AVERAGE should be calculated from @ least 5-7 BP readings taken @ different times of day on different days of week. You should not respond to isolated BP readings , but rather the AVERAGE for that week .Please bring your  blood pressure cuff to office visits to verify that it is reliable.It  can also be checked against the blood pressure device at the pharmacy. Finger or wrist cuffs are not dependable; an arm cuff is. The Stress Test  referral will be scheduled and you'll be notified of the time.

## 2013-12-28 NOTE — Progress Notes (Signed)
Pre visit review using our clinic review tool, if applicable. No additional management support is needed unless otherwise documented below in the visit note. 

## 2013-12-29 ENCOUNTER — Other Ambulatory Visit (INDEPENDENT_AMBULATORY_CARE_PROVIDER_SITE_OTHER): Payer: BC Managed Care – PPO

## 2013-12-29 DIAGNOSIS — Z0000 Encounter for general adult medical examination without abnormal findings: Secondary | ICD-10-CM

## 2013-12-29 LAB — CBC WITH DIFFERENTIAL/PLATELET
BASOS ABS: 0 10*3/uL (ref 0.0–0.1)
Basophils Relative: 0.6 % (ref 0.0–3.0)
EOS ABS: 0.2 10*3/uL (ref 0.0–0.7)
EOS PCT: 3.2 % (ref 0.0–5.0)
HCT: 45.8 % (ref 39.0–52.0)
Hemoglobin: 15.6 g/dL (ref 13.0–17.0)
Lymphocytes Relative: 52.2 % — ABNORMAL HIGH (ref 12.0–46.0)
Lymphs Abs: 2.6 10*3/uL (ref 0.7–4.0)
MCHC: 34.2 g/dL (ref 30.0–36.0)
MCV: 94.9 fl (ref 78.0–100.0)
MONO ABS: 0.4 10*3/uL (ref 0.1–1.0)
Monocytes Relative: 7.9 % (ref 3.0–12.0)
Neutro Abs: 1.8 10*3/uL (ref 1.4–7.7)
Neutrophils Relative %: 36.1 % — ABNORMAL LOW (ref 43.0–77.0)
PLATELETS: 217 10*3/uL (ref 150.0–400.0)
RBC: 4.82 Mil/uL (ref 4.22–5.81)
RDW: 12.3 % (ref 11.5–15.5)
WBC: 5 10*3/uL (ref 4.0–10.5)

## 2013-12-29 LAB — BASIC METABOLIC PANEL
BUN: 17 mg/dL (ref 6–23)
CALCIUM: 9.3 mg/dL (ref 8.4–10.5)
CO2: 27 mEq/L (ref 19–32)
CREATININE: 1 mg/dL (ref 0.4–1.5)
Chloride: 107 mEq/L (ref 96–112)
GFR: 87.64 mL/min (ref >60.00)
Glucose, Bld: 92 mg/dL (ref 70–99)
Potassium: 4.1 mEq/L (ref 3.5–5.1)
Sodium: 140 mEq/L (ref 135–145)

## 2013-12-29 LAB — HEPATIC FUNCTION PANEL
ALT: 23 U/L (ref 0–53)
AST: 21 U/L (ref 0–37)
Albumin: 4.3 g/dL (ref 3.5–5.2)
Alkaline Phosphatase: 41 U/L (ref 39–117)
BILIRUBIN DIRECT: 0.1 mg/dL (ref 0.0–0.3)
Total Bilirubin: 0.9 mg/dL (ref 0.2–1.2)
Total Protein: 7.1 g/dL (ref 6.0–8.3)

## 2013-12-29 LAB — LIPID PANEL
CHOL/HDL RATIO: 5
Cholesterol: 228 mg/dL — ABNORMAL HIGH (ref 0–200)
HDL: 45.9 mg/dL (ref >39.00)
LDL CALC: 148 mg/dL — AB (ref 0–99)
TRIGLYCERIDES: 172 mg/dL — AB (ref 0.0–149.0)
VLDL: 34.4 mg/dL (ref 0.0–40.0)

## 2013-12-29 LAB — TSH: TSH: 3.75 u[IU]/mL (ref 0.35–4.50)

## 2014-01-10 ENCOUNTER — Encounter: Payer: Self-pay | Admitting: Internal Medicine

## 2014-01-18 ENCOUNTER — Encounter: Payer: Self-pay | Admitting: Internal Medicine

## 2014-01-26 ENCOUNTER — Telehealth (HOSPITAL_COMMUNITY): Payer: Self-pay

## 2014-01-31 ENCOUNTER — Ambulatory Visit (HOSPITAL_COMMUNITY)
Admission: RE | Admit: 2014-01-31 | Discharge: 2014-01-31 | Disposition: A | Discharge status: Home / Self Care | Payer: BC Managed Care – PPO | Source: Ambulatory Visit | Attending: Cardiology | Admitting: Cardiology

## 2014-01-31 DIAGNOSIS — R0609 Other forms of dyspnea: Secondary | ICD-10-CM

## 2014-01-31 DIAGNOSIS — R0989 Other specified symptoms and signs involving the circulatory and respiratory systems: Secondary | ICD-10-CM

## 2014-01-31 DIAGNOSIS — E785 Hyperlipidemia, unspecified: Secondary | ICD-10-CM

## 2014-01-31 NOTE — Telephone Encounter (Signed)
Patient called [facility] CMA requesting stress test results that were done this morning.  Patient informed that the stress test has not been dictated at this time.  Advised to wait for results to be completed.

## 2014-01-31 NOTE — Procedures (Signed)
Exercise Treadmill Test  Pre-Exercise Testing Evaluation Rhythm: normal sinus  Rate: 74   Very high voltage Limb & precordial leads.   ST Segments:  no significant ST changes at rest     Test  Exercise Tolerance Test Ordering MD: Unice Cobble    Unique Test No: 1   Treadmill:  1  Indication for ETT: exertional dyspnea  Contraindication to ETT: No   Stress Modality: exercise - treadmill  Cardiac Imaging Performed: non   Protocol: standard Bruce - maximal  Max BP:  191/76  Max MPHR (bpm):  169 85% MPR (bpm):  143  MPHR obtained (bpm):  155 % MPHR obtained:  91  Reached 85% MPHR (min:sec):  9:22 Total Exercise Time (min-sec):  10  Workload in METS:  11.7 Borg Scale: 15  Reason ETT Terminated:  ST depression (>66mm)    ST Segment Analysis At Rest: normal ST segments - no evidence of significant ST depression With Exercise: significant ischemic ST depression - ~2 mm in Inferior Leads (II, III, aVF); -1 mm in V4-V6  Other Information Arrhythmia:  No Angina during ETT:  absent (0); dyspnea Quality of ETT:  diagnostic  ETT Interpretation:  abnormal - evidence of ST depression consistent with ischemia  Comments: Excellent Exercise Tolerance for Age Normal BP response  Ischemic- horizontal ST Segment Depression begin @ 7 min exercise - continued to walk 4 min with worsening depressions, but no CP only dyspnea. -- Absence of presenting pain at this level of exertion would suggest that the presenting CP is not ischemic, however with notable ischemic ECG changes, additional diagnostic evaluation is recommended.  Duke TM Score: 10 min - 2 (mm ST depressions) * 5 - 0 (no anigna)*4 = 0;  Moderate Risk  Recommendations: Additional Diagnostic Testing Is Indicated -- consider imaging modality (Exercise Echo vs. Exercise Cardiolite Nuclear ST) in the absence of symptoms.   Leonie Man, M.D., M.S. Interventional Cardiologist   Pager # [phone] 01/31/2014

## 2014-02-01 ENCOUNTER — Telehealth: Payer: Self-pay | Admitting: Internal Medicine

## 2014-02-01 ENCOUNTER — Other Ambulatory Visit: Payer: Self-pay | Admitting: Internal Medicine

## 2014-02-01 ENCOUNTER — Encounter: Payer: Self-pay | Admitting: Internal Medicine

## 2014-02-01 DIAGNOSIS — R9439 Abnormal result of other cardiovascular function study: Secondary | ICD-10-CM

## 2014-02-01 NOTE — Telephone Encounter (Signed)
Patient has been advised that further testing is needed and our patient care coordinator will be in contact with him

## 2014-02-01 NOTE — Telephone Encounter (Signed)
Patient left a message that he would like to discuss lab work.  Please call.

## 2014-02-06 ENCOUNTER — Telehealth: Payer: Self-pay | Admitting: Internal Medicine

## 2014-02-07 NOTE — Telephone Encounter (Signed)
Closed encounter °

## 2014-02-12 ENCOUNTER — Encounter: Payer: Self-pay | Admitting: Internal Medicine

## 2014-02-15 NOTE — Telephone Encounter (Signed)
Encounter complete. 

## 2014-02-21 ENCOUNTER — Encounter: Payer: Self-pay | Admitting: Internal Medicine

## 2014-02-21 ENCOUNTER — Ambulatory Visit (INDEPENDENT_AMBULATORY_CARE_PROVIDER_SITE_OTHER): Payer: BC Managed Care – PPO | Admitting: Internal Medicine

## 2014-02-21 VITALS — BP 142/80 | HR 71 | Temp 98.3°F | Wt 184.8 lb

## 2014-02-21 DIAGNOSIS — R0989 Other specified symptoms and signs involving the circulatory and respiratory systems: Secondary | ICD-10-CM

## 2014-02-21 DIAGNOSIS — R9439 Abnormal result of other cardiovascular function study: Secondary | ICD-10-CM

## 2014-02-21 DIAGNOSIS — M7580 Other shoulder lesions, unspecified shoulder: Secondary | ICD-10-CM

## 2014-02-21 DIAGNOSIS — M7541 Impingement syndrome of right shoulder: Secondary | ICD-10-CM

## 2014-02-21 DIAGNOSIS — M25819 Other specified joint disorders, unspecified shoulder: Secondary | ICD-10-CM

## 2014-02-21 DIAGNOSIS — R0609 Other forms of dyspnea: Secondary | ICD-10-CM

## 2014-02-21 NOTE — Progress Notes (Signed)
Pre visit review using our clinic review tool, if applicable. No additional management support is needed unless otherwise documented below in the visit note. 

## 2014-02-21 NOTE — Progress Notes (Signed)
   Subjective:    Patient ID: Brendan Sharp, male    DOB: 05/05/1961, 53 y.o.   MRN: [id number]  HPI  The stress test results were reviewed and a copy of the report provided.  R shoulder pain since 02/20/14 after power washing house. NSAIDs help.    Review of Systems  He is now not playing soccer; he has not had another episode of exertional dyspnea. He never had any exertional chest pain.       Objective:   Physical Exam Appears healthy and well-nourished & in no acute distress  No carotid bruits are present.No neck pain distention present at 10 - 15 degrees. Thyroid normal to palpation  Heart rhythm and rate are normal with no gallop or murmur. ? MITRAL CLICK @ apex  Chest is clear with no increased work of breathing  There is no evidence of aortic aneurysm or renal artery bruits  Abdomen soft with no organomegaly or masses. No HJR  No clubbing, cyanosis or edema present.  Pedal pulses are intact   Some discomfort with ROM R shoulder.  No ischemic skin changes are present . Fingernails healthy   Alert and oriented. Strength, tone, DTRs reflexes normal          Assessment & Plan:  #1 DOE , no recurrence #2 ST-T depression on ETT #3 ? MVP #4 R shoulder impingement Plan: as per Dr Tamala Julian

## 2014-02-21 NOTE — Patient Instructions (Signed)
Nonsteroidal anti-inflammatory agents as per label instructions & only with food in  the stomach. These are associated with an increased risk of gastritis. Use an anti-inflammatory cream such as Aspercreme or Zostrix cream twice a day to the affected area as needed. In lieu of this warm moist compresses or  hot water bottle can be used. Do not apply ice .

## 2014-02-21 NOTE — Assessment & Plan Note (Signed)
   He has an appointment 03/26/14 with Dr. Pernell Dupre. Options would be exercise echo versus exercise Cardiolite nuclear stress test. After that evaluation; Dr. Tamala Julian might recommend cath.

## 2014-02-28 NOTE — Telephone Encounter (Signed)
Encounter complete. 

## 2014-03-14 ENCOUNTER — Institutional Professional Consult (permissible substitution): Payer: BC Managed Care – PPO | Admitting: Internal Medicine

## 2014-03-26 ENCOUNTER — Encounter: Payer: Self-pay | Admitting: Interventional Cardiology

## 2014-03-26 ENCOUNTER — Ambulatory Visit (INDEPENDENT_AMBULATORY_CARE_PROVIDER_SITE_OTHER): Payer: BC Managed Care – PPO | Admitting: Interventional Cardiology

## 2014-03-26 VITALS — BP 126/88 | HR 89 | Ht 70.0 in | Wt 181.2 lb

## 2014-03-26 DIAGNOSIS — R9430 Abnormal result of cardiovascular function study, unspecified: Secondary | ICD-10-CM

## 2014-03-26 NOTE — Progress Notes (Signed)
Patient ID: Brendan Sharp, male   DOB: 11-11-1960, 53 y.o.   MRN: [id number]    [address], [address] [place], [place]  [address] Phone: [phone] Fax:  [phone]  Date:  03/26/2014   ID:  Brendan Sharp, DOB 08/11/1961, MRN [id number]  PCP:  Unice Cobble, MD   ASSESSMENT:  1. Abnormal ECG response to exercise 2. Atypical left chest pain 3. History of occasional blood pressure elevation  PLAN:  1. Stress Cardiolite   SUBJECTIVE: Brendan Sharp is a 53 y.o. male with no prior cardiac history. Prior echocardiogram for left subclavicular discomfort in 2006. Was told that the study was normal. We don't have records. A similar discomfort occurred while playing soccer earlier this summer. He was sent for an exercise treadmill test. This was performed on 01/31/2014 and interpreted by Dr. Ellyn Hack. There is inferior and lateral 1/2-2 mm horizontal ST segment depression. The patient exercised nearly 10 minutes and had no symptoms. He does have prominent inferior lead voltage suggesting the possibility of ventricular hypertrophy. He has continued to play soccer and has had no recurrence of chest discomfort. There is a question of a click on cardiac exam when seen by Dr. Linna Darner. The patient denies orthopnea, PND, palpitations, syncope, and physical limitations. Is there been a smoker and is not diabetic.   Wt Readings from Last 3 Encounters:  03/26/14 181 lb 3.2 oz (82.192 kg)  02/21/14 184 lb 12 oz (83.802 kg)  12/28/13 182 lb 3.2 oz (82.645 kg)     Past Medical History  Diagnosis Date  . IBS (irritable bowel syndrome)     Dr Sharlett Iles  . Hyperlipemia   . Esophageal reflux     no Endo to date  . Abnormal serum level of alkaline phosphatase 10/23/11    37    Current Outpatient Prescriptions  Medication Sig Dispense Refill  . aspirin EC 81 MG tablet Take 81 mg by mouth daily.      Marland Kitchen HYPERCARE 20 % external solution as directed.       No current facility-administered  medications for this visit.    Allergies:    Allergies  Allergen Reactions  . Epinephrine     Tachycardia post shot @ [facility] ( he is unsure whether it was epinephrine or Lidocaine)    Social History:  The patient  reports that he has never smoked. He has never used smokeless tobacco. He reports that he drinks about 4.2 ounces of alcohol per week. He reports that he does not use illicit drugs.  Past Surgical History  Procedure Laterality Date  . Tonsillectomy and adenoidectomy    . Colonoscopy  2013    for IBS, Dr Sharlett Iles  . Hydrocele       surgery @ age 75     Past Medical History  Diagnosis Date  . IBS (irritable bowel syndrome)     Dr Sharlett Iles  . Hyperlipemia   . Esophageal reflux     no Endo to date  . Abnormal serum level of alkaline phosphatase 10/23/11    37   ROS:  Please see the history of present illness.   Denies wheezing, asthma, palpitations, syncope, orthopnea, edema, arm and leg discomfort, claudication, and history of blood clots.   All other systems reviewed and negative.   OBJECTIVE: VS:  BP 126/88  Pulse 89  Ht 5\' 10"  (1.778 m)  Wt 181 lb 3.2 oz (82.192 kg)  BMI 26.00 kg/m2 Well nourished, well developed, in no acute  distress, who appears his stated age 14: normalWith no jaundice or pallor  Neck: JVD flat. Carotid bruit absent  Cardiac:  normal S1, S2; RRR; no murmur Lungs:  clear to auscultation bilaterally, no wheezing, rhonchi or rales Abd: soft, nontender, no hepatomegaly Ext: Edema none. Pulses 2+ and symmetric Skin: warm and dry Neuro:  CNs 2-12 intact, no focal abnormalities noted  EKG:  Prominent inferior lead voltage. Otherwise unremarkable       Signed, Illene Labrador III, MD 03/26/2014 2:37 PM

## 2014-03-26 NOTE — Patient Instructions (Signed)
Your physician recommends that you continue on your current medications as directed. Please refer to the Current Medication list given to you today.  Your physician has requested that you have en exercise stress myoview. For further information please visit [email or web address]. Please follow instruction sheet, as given.  Follow up pending results

## 2014-03-28 ENCOUNTER — Ambulatory Visit (HOSPITAL_COMMUNITY): Payer: BC Managed Care – PPO | Attending: Cardiovascular Disease | Admitting: Radiology

## 2014-03-28 VITALS — BP 131/82 | Ht 70.0 in | Wt 178.0 lb

## 2014-03-28 DIAGNOSIS — R079 Chest pain, unspecified: Secondary | ICD-10-CM | POA: Insufficient documentation

## 2014-03-28 DIAGNOSIS — R5381 Other malaise: Secondary | ICD-10-CM | POA: Diagnosis not present

## 2014-03-28 DIAGNOSIS — R5383 Other fatigue: Secondary | ICD-10-CM

## 2014-03-28 DIAGNOSIS — R9430 Abnormal result of cardiovascular function study, unspecified: Secondary | ICD-10-CM | POA: Diagnosis present

## 2014-03-28 MED ORDER — TECHNETIUM TC 99M SESTAMIBI GENERIC - CARDIOLITE
33.0000 | Freq: Once | INTRAVENOUS | Status: AC | PRN
  Administered 2014-03-28: 33 via INTRAVENOUS

## 2014-03-28 MED ORDER — TECHNETIUM TC 99M SESTAMIBI GENERIC - CARDIOLITE
11.0000 | Freq: Once | INTRAVENOUS | Status: AC | PRN
  Administered 2014-03-28: 11 via INTRAVENOUS

## 2014-03-28 NOTE — Progress Notes (Signed)
[facility] [address] [place], [place] [address] [phone]    Cardiology Nuclear Med Study  Brendan Sharp is a 53 y.o. male     MRN : [id number]     DOB: [birth year]  Procedure Date: 03/28/2014  Nuclear Med Background Indication for Stress Test:  Evaluation for Ischemia, and 01-31-2014 ETT: Abnormal ST depression during  exercise History:  01/31/14 Abnormal GXT ST Depression, 2006 ECHO No records Cardiac Risk Factors: Lipids  Symptoms:  Chest Pain   Nuclear Pre-Procedure Caffeine/Decaff Intake:  None > 12 hrs NPO After: 8:30pm   Lungs:  clear O2 Sat: 98% on room air. IV 0.9% NS with Angio Cath:  22g  IV Site: R Antecubital x 1, tolerated well IV Started by:  [name], RN  Chest Size (in):  42 Cup Size: n/a  Height: 5\' 10"  (1.778 m)  Weight:  178 lb (80.74 kg)  BMI:  Body mass index is 25.54 kg/(m^2). Tech Comments:  N/A    Nuclear Med Study 1 or 2 day study: 1 day  Stress Test Type:  Stress  Reading MD: N/A  Order Authorizing Provider:  Daneen Schick, III, MD  Resting Radionuclide: Technetium 39m Sestamibi  Resting Radionuclide Dose: 11.0 mCi   Stress Radionuclide:  Technetium 43m Sestamibi  Stress Radionuclide Dose: 33.0 mCi           Stress Protocol Rest HR: 64 Stress HR: 148  Rest BP: 131/82 Stress BP: 182/81  Exercise Time (min): 10:00 METS: 11.70   Predicted Max HR: 169 bpm % Max HR: 87.57 bpm Rate Pressure Product: [id number]   Dose of Adenosine (mg):  n/a Dose of Lexiscan: n/a mg  Dose of Atropine (mg): n/a Dose of Dobutamine: n/a mcg/kg/min (at max HR)  Stress Test Technologist: Perrin Maltese, EMT-P  Nuclear Technologist:  Annye Rusk, CNMT     Rest Procedure:  Myocardial perfusion imaging was performed at rest 45 minutes following the intravenous administration of Technetium 20m Sestamibi. Rest ECG: NSR , LVH  Stress Procedure:  The patient exercised on the treadmill utilizing the Bruce Protocol for 10:00 minutes.  The patient stopped due to fatigue and denied any chest pain.  Technetium 14m Sestamibi was injected at peak exercise and myocardial perfusion imaging was performed after a brief delay. Stress ECG: 2 mm downsloaping STD in the inferolateral leads that resolve 1 minute into recovery - low specificity.  QPS Raw Data Images:  Normal; no motion artifact; normal heart/lung ratio. Stress Images:  Normal homogeneous uptake in all areas of the myocardium. Rest Images:  Normal homogeneous uptake in all areas of the myocardium. Subtraction (SDS):  No evidence of ischemia. Transient Ischemic Dilatation (Normal <1.22):  0.95 Lung/Heart Ratio (Normal <0.45):  0.34  Quantitative Gated Spect Images QGS EDV:  110 ml QGS ESV:  45 ml  Impression Exercise Capacity:  Excellent exercise capacity. BP Response:  Hypertensive blood pressure response. Clinical Symptoms:  Fatigue ECG Impression:  2 mm downsloaping STD in the inferolateral leads that resolve 1 minute into recovery - low specificity. Comparison with Prior Nuclear Study: No previous nuclear study performed  Overall Impression:  Low risk stress nuclear study with abnormal ECG posrtion but normal perfusion imaging. .  LV Ejection Fraction: 59%.  LV Wall Motion:  NL LV Function; NL Wall Motion   Dorothy Spark 03/28/2014

## 2014-04-03 ENCOUNTER — Telehealth: Payer: Self-pay | Admitting: Interventional Cardiology

## 2014-04-03 NOTE — Telephone Encounter (Signed)
Preliminary results of stress test given to pt. He is aware that Dr. Tamala Julian needs to review results and make recommendations if needed. Pt verbalized understanding.

## 2014-04-03 NOTE — Telephone Encounter (Signed)
New message          Pt calling for results from stress test  If they are ready

## 2014-04-10 ENCOUNTER — Telehealth: Payer: Self-pay

## 2014-04-10 NOTE — Telephone Encounter (Signed)
Message copied by Lamar Laundry on Tue Apr 10, 2014  8:43 AM ------      Message from: Daneen Schick      Created: Fri Apr 06, 2014 10:05 PM       Low risk ------

## 2014-04-10 NOTE — Telephone Encounter (Signed)
pt aware of myoview results.Normal.Low risk.pt adv to f/u prn. pt verbalized understanding.pt results be mailed to him.

## 2014-04-10 NOTE — Telephone Encounter (Signed)
Message copied by Lamar Laundry on Tue Apr 10, 2014  8:39 AM ------      Message from: Daneen Schick      Created: Fri Apr 06, 2014 10:05 PM       Low risk ------

## 2014-07-09 ENCOUNTER — Telehealth: Payer: Self-pay | Admitting: Internal Medicine

## 2014-07-09 NOTE — Telephone Encounter (Signed)
Pt requesting lab orders be put in for him  to check cholesterol level as pt stopped taking Lipitor. Pls advise. Pls let pt know.

## 2014-07-10 ENCOUNTER — Other Ambulatory Visit: Payer: Self-pay | Admitting: Internal Medicine

## 2014-07-10 DIAGNOSIS — E785 Hyperlipidemia, unspecified: Secondary | ICD-10-CM

## 2014-07-10 NOTE — Telephone Encounter (Signed)
Left patient a message stating lab orders he requested have been placed and I gave him labs hours

## 2014-07-10 NOTE — Telephone Encounter (Signed)
Order entered

## 2014-07-18 ENCOUNTER — Other Ambulatory Visit (INDEPENDENT_AMBULATORY_CARE_PROVIDER_SITE_OTHER): Payer: BC Managed Care – PPO

## 2014-07-18 DIAGNOSIS — E785 Hyperlipidemia, unspecified: Secondary | ICD-10-CM

## 2014-07-18 LAB — LIPID PANEL
CHOLESTEROL: 244 mg/dL — AB (ref 0–200)
HDL: 47.8 mg/dL (ref >39.00)
LDL Cholesterol: 172 mg/dL — ABNORMAL HIGH (ref 0–99)
NonHDL: 196.2
TRIGLYCERIDES: 122 mg/dL (ref 0.0–149.0)
Total CHOL/HDL Ratio: 5
VLDL: 24.4 mg/dL (ref 0.0–40.0)

## 2014-12-24 ENCOUNTER — Other Ambulatory Visit: Payer: Self-pay | Admitting: Physician Assistant

## 2015-01-01 ENCOUNTER — Encounter (HOSPITAL_BASED_OUTPATIENT_CLINIC_OR_DEPARTMENT_OTHER): Payer: Self-pay | Admitting: *Deleted

## 2015-01-02 NOTE — H&P (Signed)
[facility] [address] [place], [place] [address] [phone] A Division of [facility]  Ninetta Lights, M.D.   Robert A. Noemi Chapel, M.D.   Faythe Casa, M.D.   Johnny Bridge, M.D.   Almedia Balls, M.D Ernesta Amble. Percell Miller, M.D.  Joseph Pierini, M.D.  Lanier Prude, M.D.    Verner Chol, M.D. Lovett Calender, PA- C  Mary L. Venida Jarvis, PA-C  Kirstin A. Shepperson, PA-C  Josh Seffner, PA-C  [place], [place]   RE: Brendan Sharp, Brendan Sharp                                [id number]      DOB: July 25, 1961 INITIAL EVALUATION:  11-16-14 This is a 54 year-old male who is a new patient and presents to our clinic today with right knee pain and fullness.  He states this has been going on for the past month or so.  No specific change in activity or injury noted.  He is however an active soccer player and has been running longer distances.  The pain and fullness he is having is primarily posterior.  Occasional anterior knee pain, but nothing substantial.  He has associate.  He describes this as a constant mild ache with some posterior tightness.  No mechanical symptoms or instability.  No true rest pain or night pain.  This is really aggravated with walking or while playing soccer.  He has tried a knee brace which seems to give him a little more security.  He has tried Ibuprofen without relief of symptoms.  No radicular symptoms noted.   Past medical history: Unremarkable for diabetes, high blood pressure or heart disease. Current medications: Aspirin 81 mg. Allergies: No known drug allergies. Family history: Unremarkable for diabetes, hypertension or heart disease. Social history: Does not smoke, but he does drink about three alcoholic beverages per week.  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      EXAMINATION: Well-developed, well-nourished male in no acute distress.  Alert and oriented x 3.  Height: 5?10.  Weight: 181  pounds.  Blood pressure: 127/82.  Pulse: 72.  Examination of his right knee reveals no effusion.  Varus thrust.  Range of motion 0-130 degrees.  Minimal medial joint line tenderness.  Mildly positive medial McMurray.  Minimal patellofemoral crepitus.  Minimal posterior fullness.  Negative log roll.  Negative straight leg raise.    X-RAYS: Four views with sizing balls of the right knee reveal fairly well preserved tricompartmental joint space.    IMPRESSION: Possible medial meniscus tear.  PLAN: At this point in time we are going to proceed with a 2:6 intraarticular injection to the right knee to settle things down.  He is complaining more of the effect, rather than the cause of a potential meniscus tear.  If he is not any better several weeks following the injection we will proceed with an MRI.  PROCEDURE NOTE: The patient's clinical condition is marked by substantial pain and/or significant functional disability.  Other conservative therapy has not provided relief, is contraindicated, or not appropriate.  There is a reasonable likelihood that injection will significantly improve the patient's pain and/or functional disability. Patient is seated on the exam table.  The right knee is prepped with Betadine and alcohol and injected with 2:6 Depo-Medrol/Marcaine.  Patient tolerated the procedure without difficulty.   Ninetta Lights, M.D. Dictated  by: Tawanna Cooler, PA-C Electronically verified by Ninetta Lights, M.D. DFM(LS):jjh D 11-16-14 T 11-20-14  [facility] [address] [place], [place] [address] [phone] A Division of [facility]  Ninetta Lights, M.D.   Robert A. Noemi Chapel, M.D.   Faythe Casa, M.D.   Johnny Bridge, M.D.   Almedia Balls, M.D. Ernesta Amble. Percell Miller, M.D.  Joseph Pierini, M.D.  Lanier Prude, M.D.    Verner Chol, M.D. Lovett Calender, PA- C  Mary L. Venida Jarvis, PA-C    Kirstin A. Shepperson, PA-C  Josh [place], PA-C  [place], [place]  RE: Brendan Sharp, Brendan Sharp [id number] DOB: 1961-05-14 PROGRESS NOTE: 12-18-14  HPI: Brendan Sharp comes in for a follow up for his right knee.  His symptoms have persisted.  He was seen in my absence by Dr. Alfonso Ramus.  MRI was obtained.  This showed a radial tear with displaced fragment medial meniscus of his right knee.  This is pretty much what we thought he had, hoping that it was not this bad.  His symptoms are getting worse, more mechanical in nature.    DISPOSITION: I met with him for 25 minutes of face-to-face time going over his report and scan and discussing it all with him.  We are going to proceed with exam under anesthesia arthroscopy care of his meniscus tear.  Procedure risks, benefits, and complications reviewed.  Anticipated recovery, rehab outcome also outlined.  He understands and agrees.  I will see him at the time of operative intervention.  Ninetta Lights, M.D.  Electronically verified by Ninetta Lights, M.D.  DFM:pjd D 12-18-14 T 12-19-14

## 2015-01-03 ENCOUNTER — Ambulatory Visit (HOSPITAL_BASED_OUTPATIENT_CLINIC_OR_DEPARTMENT_OTHER)
Admission: RE | Admit: 2015-01-03 | Discharge: 2015-01-03 | Disposition: A | Discharge status: Home / Self Care | Payer: BLUE CROSS/BLUE SHIELD | Source: Ambulatory Visit | Attending: Orthopedic Surgery | Admitting: Orthopedic Surgery

## 2015-01-03 ENCOUNTER — Ambulatory Visit (HOSPITAL_BASED_OUTPATIENT_CLINIC_OR_DEPARTMENT_OTHER): Payer: BLUE CROSS/BLUE SHIELD | Admitting: Anesthesiology

## 2015-01-03 ENCOUNTER — Encounter (HOSPITAL_BASED_OUTPATIENT_CLINIC_OR_DEPARTMENT_OTHER)
Admission: RE | Disposition: A | Discharge status: Home / Self Care | Payer: Self-pay | Source: Ambulatory Visit | Attending: Orthopedic Surgery

## 2015-01-03 ENCOUNTER — Other Ambulatory Visit: Payer: Self-pay | Admitting: Physician Assistant

## 2015-01-03 ENCOUNTER — Encounter (HOSPITAL_BASED_OUTPATIENT_CLINIC_OR_DEPARTMENT_OTHER): Payer: Self-pay | Admitting: *Deleted

## 2015-01-03 DIAGNOSIS — X58XXXA Exposure to other specified factors, initial encounter: Secondary | ICD-10-CM | POA: Diagnosis not present

## 2015-01-03 DIAGNOSIS — Y92322 Soccer field as the place of occurrence of the external cause: Secondary | ICD-10-CM | POA: Insufficient documentation

## 2015-01-03 DIAGNOSIS — S83241A Other tear of medial meniscus, current injury, right knee, initial encounter: Secondary | ICD-10-CM | POA: Insufficient documentation

## 2015-01-03 DIAGNOSIS — Y9366 Activity, soccer: Secondary | ICD-10-CM | POA: Diagnosis not present

## 2015-01-03 HISTORY — PX: KNEE ARTHROSCOPY WITH MEDIAL MENISECTOMY: SHX5651

## 2015-01-03 HISTORY — DX: Chondromalacia patellae, right knee: M22.41

## 2015-01-03 HISTORY — PX: CHONDROPLASTY: SHX5177

## 2015-01-03 HISTORY — DX: Unspecified osteoarthritis, unspecified site: M19.90

## 2015-01-03 SURGERY — ARTHROSCOPY, KNEE, WITH MEDIAL MENISCECTOMY
Anesthesia: General | Site: Knee | Laterality: Right

## 2015-01-03 MED ORDER — MEPERIDINE HCL 25 MG/ML IJ SOLN: 6.2500 mg | INTRAMUSCULAR | Status: DC | PRN

## 2015-01-03 MED ORDER — METHYLPREDNISOLONE ACETATE 80 MG/ML IJ SUSP
INTRAMUSCULAR | Status: DC | PRN
  Administered 2015-01-03: 80 mg

## 2015-01-03 MED ORDER — ONDANSETRON HCL 4 MG/2ML IJ SOLN
INTRAMUSCULAR | Status: DC | PRN
Start: 2015-01-03 — End: 2015-01-03
  Administered 2015-01-03: 4 mg via INTRAVENOUS

## 2015-01-03 MED ORDER — SODIUM CHLORIDE 0.9 % IR SOLN
Status: DC | PRN
Start: 2015-01-03 — End: 2015-01-03
  Administered 2015-01-03: 6000 mL

## 2015-01-03 MED ORDER — CHLORHEXIDINE GLUCONATE 4 % EX LIQD: 60.0000 mL | Freq: Once | CUTANEOUS | Status: DC

## 2015-01-03 MED ORDER — HYDROMORPHONE HCL 1 MG/ML IJ SOLN: 0.2500 mg | INTRAMUSCULAR | Status: DC | PRN

## 2015-01-03 MED ORDER — LACTATED RINGERS IV SOLN: INTRAVENOUS | Status: DC

## 2015-01-03 MED ORDER — DEXAMETHASONE SODIUM PHOSPHATE 4 MG/ML IJ SOLN
INTRAMUSCULAR | Status: DC | PRN
  Administered 2015-01-03: 10 mg via INTRAVENOUS

## 2015-01-03 MED ORDER — OXYCODONE-ACETAMINOPHEN 5-325 MG PO TABS: 1.0000 | ORAL_TABLET | ORAL | Status: DC | PRN

## 2015-01-03 MED ORDER — MIDAZOLAM HCL 2 MG/2ML IJ SOLN
1.0000 mg | INTRAMUSCULAR | Status: DC | PRN
  Administered 2015-01-03: 2 mg via INTRAVENOUS

## 2015-01-03 MED ORDER — FENTANYL CITRATE (PF) 100 MCG/2ML IJ SOLN
INTRAMUSCULAR | Status: DC | PRN
  Administered 2015-01-03: 100 ug via INTRAVENOUS
  Administered 2015-01-03: 50 ug via INTRAVENOUS

## 2015-01-03 MED ORDER — MIDAZOLAM HCL 2 MG/2ML IJ SOLN
INTRAMUSCULAR | Status: AC
  Filled 2015-01-03: qty 2

## 2015-01-03 MED ORDER — LACTATED RINGERS IV SOLN
INTRAVENOUS | Status: DC
  Administered 2015-01-03: 08:00:00 via INTRAVENOUS

## 2015-01-03 MED ORDER — BUPIVACAINE HCL (PF) 0.5 % IJ SOLN
INTRAMUSCULAR | Status: DC | PRN
  Administered 2015-01-03: 20 mL

## 2015-01-03 MED ORDER — GLYCOPYRROLATE 0.2 MG/ML IJ SOLN: 0.2000 mg | Freq: Once | INTRAMUSCULAR | Status: DC | PRN

## 2015-01-03 MED ORDER — FENTANYL CITRATE (PF) 100 MCG/2ML IJ SOLN
INTRAMUSCULAR | Status: AC
  Filled 2015-01-03: qty 6

## 2015-01-03 MED ORDER — ONDANSETRON HCL 4 MG PO TABS: 4.0000 mg | ORAL_TABLET | Freq: Three times a day (TID) | ORAL | Status: DC | PRN

## 2015-01-03 MED ORDER — LIDOCAINE HCL (CARDIAC) 20 MG/ML IV SOLN
INTRAVENOUS | Status: DC | PRN
  Administered 2015-01-03: 60 mg via INTRAVENOUS

## 2015-01-03 MED ORDER — PROPOFOL 10 MG/ML IV BOLUS
INTRAVENOUS | Status: DC | PRN
  Administered 2015-01-03: 200 mg via INTRAVENOUS

## 2015-01-03 MED ORDER — OXYCODONE HCL 5 MG PO TABS: 5.0000 mg | ORAL_TABLET | Freq: Once | ORAL | Status: DC | PRN

## 2015-01-03 MED ORDER — CEFAZOLIN SODIUM-DEXTROSE 2-3 GM-% IV SOLR
2.0000 g | INTRAVENOUS | Status: AC
  Administered 2015-01-03: 2 g via INTRAVENOUS

## 2015-01-03 MED ORDER — FENTANYL CITRATE (PF) 100 MCG/2ML IJ SOLN: 50.0000 ug | INTRAMUSCULAR | Status: DC | PRN

## 2015-01-03 MED ORDER — KETOROLAC TROMETHAMINE 30 MG/ML IJ SOLN
INTRAMUSCULAR | Status: DC | PRN
  Administered 2015-01-03: 30 mg via INTRAVENOUS

## 2015-01-03 MED ORDER — OXYCODONE HCL 5 MG/5ML PO SOLN: 5.0000 mg | Freq: Once | ORAL | Status: DC | PRN

## 2015-01-03 SURGICAL SUPPLY — 39 items
BANDAGE ELASTIC 6 VELCRO ST LF (GAUZE/BANDAGES/DRESSINGS) ×2 IMPLANT
BLADE CUDA 5.5 (BLADE) IMPLANT
BLADE CUDA GRT WHITE 3.5 (BLADE) IMPLANT
BLADE CUTTER GATOR 3.5 (BLADE) ×2 IMPLANT
BLADE CUTTER MENIS 5.5 (BLADE) IMPLANT
BLADE GREAT WHITE 4.2 (BLADE) ×2 IMPLANT
BUR OVAL 4.0 (BURR) IMPLANT
CUTTER MENISCUS  4.2MM (BLADE)
CUTTER MENISCUS 4.2MM (BLADE) IMPLANT
DRAPE ARTHROSCOPY W/POUCH 90 (DRAPES) ×2 IMPLANT
DURAPREP 26ML APPLICATOR (WOUND CARE) ×2 IMPLANT
ELECT MENISCUS 165MM 90D (ELECTRODE) IMPLANT
ELECT REM PT RETURN 9FT ADLT (ELECTROSURGICAL)
ELECTRODE REM PT RTRN 9FT ADLT (ELECTROSURGICAL) IMPLANT
GAUZE SPONGE 4X4 12PLY STRL (GAUZE/BANDAGES/DRESSINGS) ×4 IMPLANT
GAUZE XEROFORM 1X8 LF (GAUZE/BANDAGES/DRESSINGS) ×2 IMPLANT
GLOVE BIOGEL PI IND STRL 7.0 (GLOVE) ×2 IMPLANT
GLOVE BIOGEL PI INDICATOR 7.0 (GLOVE) ×2
GLOVE ECLIPSE 6.5 STRL STRAW (GLOVE) ×2 IMPLANT
GLOVE ECLIPSE 7.0 STRL STRAW (GLOVE) ×2 IMPLANT
GLOVE ORTHO TXT STRL SZ7.5 (GLOVE) ×2 IMPLANT
GLOVE SURG ORTHO 8.0 STRL STRW (GLOVE) ×2 IMPLANT
GOWN STRL REUS W/ TWL LRG LVL3 (GOWN DISPOSABLE) ×2 IMPLANT
GOWN STRL REUS W/ TWL XL LVL3 (GOWN DISPOSABLE) ×1 IMPLANT
GOWN STRL REUS W/TWL LRG LVL3 (GOWN DISPOSABLE) ×2
GOWN STRL REUS W/TWL XL LVL3 (GOWN DISPOSABLE) ×1
HOLDER KNEE FOAM BLUE (MISCELLANEOUS) ×2 IMPLANT
IV NS IRRIG 3000ML ARTHROMATIC (IV SOLUTION) ×4 IMPLANT
KNEE WRAP E Z 3 GEL PACK (MISCELLANEOUS) ×2 IMPLANT
MANIFOLD NEPTUNE II (INSTRUMENTS) ×2 IMPLANT
PACK ARTHROSCOPY DSU (CUSTOM PROCEDURE TRAY) ×2 IMPLANT
PACK BASIN DAY SURGERY FS (CUSTOM PROCEDURE TRAY) ×2 IMPLANT
PENCIL BUTTON HOLSTER BLD 10FT (ELECTRODE) IMPLANT
SET ARTHROSCOPY TUBING (MISCELLANEOUS) ×1
SET ARTHROSCOPY TUBING LN (MISCELLANEOUS) ×1 IMPLANT
SUT ETHILON 3 0 PS 1 (SUTURE) ×2 IMPLANT
SUT VIC AB 3-0 FS2 27 (SUTURE) IMPLANT
TOWEL OR 17X24 6PK STRL BLUE (TOWEL DISPOSABLE) ×2 IMPLANT
WATER STERILE IRR 1000ML POUR (IV SOLUTION) ×2 IMPLANT

## 2015-01-03 NOTE — Discharge Instructions (Signed)
Discharge Instructions after Knee Arthroscopy   You will have a light dressing on your knee.  Leave the dressing in place until the third day after your surgery and then remove it and place a band-aid over the stitches.  After the bandage has been removed you may shower, but do not soak the incision. You may begin gentle motion of your leg immediately after surgery. Pump your foot up and down 20 times per hour, every hour you are awake.  Apply ice to the knee 3 times per day for 30 minutes for the first 1 week until your knee is feeling comfortable again. Do not use heat.  You may begin straight leg raising exercises (if you have a brace with it on). While lying down, pull your foot all the way up, tighten your quadriceps muscle and lift your heel off of the ground. Hold this position for 2 seconds, and then let the leg back down. Repeat the exercise 10 times, at least 3 times a day.  Pain medicine has been prescribed for you.  Use your medicine as needed over the first 48 hours, and then you can begin to taper your use. You may take Extra Strength Tylenol or Tylenol only in place of the pain pills.    Please call [phone] for any problems. Including the following:  - excessive redness of the incisions - drainage for more than 4 days - fever of more than 101.5 F  *Please note that pain medications will not be refilled after hours or on weekends.   Post Anesthesia Home Care Instructions  Activity: Get plenty of rest for the remainder of the day. A responsible adult should stay with you for 24 hours following the procedure.  For the next 24 hours, DO NOT: -Drive a car -Paediatric nurse -Drink alcoholic beverages -Take any medication unless instructed by your physician -Make any legal decisions or sign important papers.  Meals: Start with liquid foods such as gelatin or soup. Progress to regular foods as tolerated. Avoid greasy, spicy, heavy foods. If nausea and/or vomiting  occur, drink only clear liquids until the nausea and/or vomiting subsides. Call your physician if vomiting continues.  Special Instructions/Symptoms: Your throat may feel dry or sore from the anesthesia or the breathing tube placed in your throat during surgery. If this causes discomfort, gargle with warm salt water. The discomfort should disappear within 24 hours.  If you had a scopolamine patch placed behind your ear for the management of post- operative nausea and/or vomiting:  1. The medication in the patch is effective for 72 hours, after which it should be removed.  Wrap patch in a tissue and discard in the trash. Wash hands thoroughly with soap and water. 2. You may remove the patch earlier than 72 hours if you experience unpleasant side effects which may include dry mouth, dizziness or visual disturbances. 3. Avoid touching the patch. Wash your hands with soap and water after contact with the patch.   Call your surgeon if you experience:   1.  Fever over 101.0. 2.  Inability to urinate. 3.  Nausea and/or vomiting. 4.  Extreme swelling or bruising at the surgical site. 5.  Continued bleeding from the incision. 6.  Increased pain, redness or drainage from the incision. 7.  Problems related to your pain medication. 8. Any change in color, movement and/or sensation 9. Any problems and/or concerns

## 2015-01-03 NOTE — Anesthesia Procedure Notes (Signed)
Procedure Name: LMA Insertion Performed by: Anelly Samarin W Pre-anesthesia Checklist: Patient identified, Timeout performed, Emergency Drugs available, Suction available and Patient being monitored Patient Re-evaluated:Patient Re-evaluated prior to inductionOxygen Delivery Method: Circle system utilized Preoxygenation: Pre-oxygenation with 100% oxygen Intubation Type: IV induction Ventilation: Mask ventilation without difficulty LMA: LMA inserted LMA Size: 4.0 Number of attempts: 1 Placement Confirmation: positive ETCO2 and breath sounds checked- equal and bilateral Tube secured with: Tape Dental Injury: Teeth and Oropharynx as per pre-operative assessment      

## 2015-01-03 NOTE — Anesthesia Postprocedure Evaluation (Signed)
  Anesthesia Post-op Note  Patient: Brendan Sharp  Procedure(s) Performed: Procedure(s): RIGHT KNEE ARTHROSCOPY CHONDROPLASTY WITH MEDIAL MENISCECTOMY (Right) CHONDROPLASTY (Right)  Patient Location: PACU  Anesthesia Type: General   Level of Consciousness: awake, alert  and oriented  Airway and Oxygen Therapy: Patient Spontanous Breathing  Post-op Pain: mild  Post-op Assessment: Post-op Vital signs reviewed  Post-op Vital Signs: Reviewed  Last Vitals:  Filed Vitals:   01/03/15 1045  BP: 135/88  Pulse: 64  Temp: 36.4 C  Resp: 18    Complications: No apparent anesthesia complications

## 2015-01-03 NOTE — Transfer of Care (Signed)
Immediate Anesthesia Transfer of Care Note  Patient: Brendan Sharp  Procedure(s) Performed: Procedure(s): RIGHT KNEE ARTHROSCOPY CHONDROPLASTY WITH MEDIAL MENISCECTOMY (Right) CHONDROPLASTY (Right)  Patient Location: PACU  Anesthesia Type:General  Level of Consciousness: awake and sedated  Airway & Oxygen Therapy: Patient Spontanous Breathing and Patient connected to face mask oxygen  Post-op Assessment: Report given to RN and Post -op Vital signs reviewed and stable  Post vital signs: Reviewed and stable  Last Vitals:  Filed Vitals:   01/03/15 0759  BP: 148/87  Pulse: 84  Temp: 37.1 C  Resp: 20    Complications: No apparent anesthesia complications

## 2015-01-03 NOTE — Interval H&P Note (Signed)
History and Physical Interval Note:  01/03/2015 7:32 AM  Brendan Sharp  has presented today for surgery, with the diagnosis of CHONDROMALACIA PATELLAE RIGHT KNEE OTHER MENISCUS DERANGEMENTS POSTERIOR HORN OF MEDIAL MENSICUS RIGHT KNEE   The various methods of treatment have been discussed with the patient and family. After consideration of risks, benefits and other options for treatment, the patient has consented to  Procedure(s): RIGHT KNEE ARTHROSCOPY CHONDROPLASTY WITH MEDIAL MENISCECTOMY (Right) as a surgical intervention .  The patient's history has been reviewed, patient examined, no change in status, stable for surgery.  I have reviewed the patient's chart and labs.  Questions were answered to the patient's satisfaction.     Jabier Deese F

## 2015-01-03 NOTE — Anesthesia Preprocedure Evaluation (Signed)
Anesthesia Evaluation  Patient identified by MRN, date of birth, ID band Patient awake    Reviewed: Allergy & Precautions, NPO status , Patient's Chart, lab work & pertinent test results  Airway Mallampati: I  TM Distance: >3 FB Neck ROM: Full    Dental  (+) Teeth Intact, Dental Advisory Given   Pulmonary  breath sounds clear to auscultation        Cardiovascular Rhythm:Regular Rate:Normal     Neuro/Psych    GI/Hepatic   Endo/Other    Renal/GU      Musculoskeletal   Abdominal   Peds  Hematology   Anesthesia Other Findings   Reproductive/Obstetrics                             Anesthesia Physical Anesthesia Plan  ASA: I  Anesthesia Plan: General   Post-op Pain Management:    Induction: Intravenous  Airway Management Planned: LMA  Additional Equipment:   Intra-op Plan:   Post-operative Plan: Extubation in OR  Informed Consent: I have reviewed the patients History and Physical, chart, labs and discussed the procedure including the risks, benefits and alternatives for the proposed anesthesia with the patient or authorized representative who has indicated his/her understanding and acceptance.   Dental advisory given  Plan Discussed with: CRNA, Anesthesiologist and Surgeon  Anesthesia Plan Comments:         Anesthesia Quick Evaluation

## 2015-01-04 ENCOUNTER — Encounter (HOSPITAL_BASED_OUTPATIENT_CLINIC_OR_DEPARTMENT_OTHER): Payer: Self-pay | Admitting: Orthopedic Surgery

## 2015-01-05 NOTE — Op Note (Signed)
Brendan Sharp, REDMON NO.:  [id number]  MEDICAL RECORD NO.:  [id number]  LOCATION:                                FACILITY:  [facility]  PHYSICIAN:  Ninetta Lights, M.D. DATE OF BIRTH:  1960/09/06  DATE OF PROCEDURE:  01/03/2015 DATE OF DISCHARGE:  01/03/2015                              OPERATIVE REPORT   PREOPERATIVE DIAGNOSIS:  Right knee displaced medial meniscus tear.  POSTOPERATIVE DIAGNOSES:  Right knee displaced medial meniscus tear with extensive grade 3 changes in the patellofemoral joint mostly on the trochlea with some chondral loose bodies.  Mild scuffing grade 2 medial femoral condyle from displaced meniscus tear.  PROCEDURE:  Right knee exam under anesthesia, arthroscopy.  Partial medial meniscectomy.  Chondroplasty, patellofemoral joint, medial femoral condyle, removal of chondral loose bodies.  SURGEON:  Ninetta Lights, M.D.  ASSISTANT:  Elmyra Ricks, PA.  ANESTHESIA:  General.  BLOOD LOSS:  Minimal.  SPECIMENS:  None.  CULTURES:  None.  COMPLICATIONS:  None.  DRESSINGS:  Soft compressive.  TOURNIQUET:  Not employed.  PROCEDURE:  The patient was brought to the operating room, placed on the operating table in a supine position.  After adequate anesthesia had been obtained, leg holder applied.  Prepped and draped in usual sterile fashion.  Two portals, one each medial and lateral parapatellar. Arthroscope was introduced.  Knee distended and inspected.  Good patellar tracking.  Very mild changes on the patella, but the entire central trochlea grade 3 with numerous unstable flaps and fragments. Chondroplasty to a stable surface.  One area that was grade 4 on the trochlea, but very lateral really not articulated with the patella.  ACL was intact.  Lateral meniscus, lateral compartment looked great.  Medial meniscus 2 large displaced tears, 1 flipped up behind the femoral condyle, another one down into the gutter.  All unstable  fragments removed.  __________ tapered into remaining meniscus.  All loose fragments removed.  A little bit of scuffing on the condyle debrided.  The entire knee examined.  No other findings were appreciated.  Instruments were fully removed. Portals were closed with nylon.  Knee injected with Depo-Medrol and Marcaine.  Anesthesia reversed.  Brought to the recovery room. Tolerated the surgery well.  No complications.     Ninetta Lights, M.D.     DFM/MEDQ  D:  01/03/2015  T:  01/04/2015  Job:  [id number]

## 2015-01-16 ENCOUNTER — Encounter: Payer: Self-pay | Admitting: Internal Medicine

## 2015-01-16 ENCOUNTER — Ambulatory Visit (INDEPENDENT_AMBULATORY_CARE_PROVIDER_SITE_OTHER): Payer: BLUE CROSS/BLUE SHIELD | Admitting: Internal Medicine

## 2015-01-16 VITALS — BP 120/82 | HR 62 | Temp 98.7°F | Wt 181.1 lb

## 2015-01-16 DIAGNOSIS — Z0000 Encounter for general adult medical examination without abnormal findings: Secondary | ICD-10-CM | POA: Diagnosis not present

## 2015-01-16 NOTE — Progress Notes (Signed)
   Subjective:    Patient ID: Brendan Sharp, male    DOB: [birth year], 54 y.o.   MRN: [id number]  HPI  He is here for a physical;acute issues denied.  He has recently had arthroscopic surgery & is recovering without complication.  He does not eat excess fried foods or red meats. He exercises running, soccer, or lifting weights for 30 minutes 5 days a week without cardiopulmonary symptoms  Colonoscopy in 2013 was negative. He has no active GI symptoms  The family history is negative for heart attack, stroke, diabetes, colon cancer, or prostate cancer      Review of Systems  Chest pain, palpitations, tachycardia, exertional dyspnea, paroxysmal nocturnal dyspnea, claudication or edema are absent. No unexplained weight loss, abdominal pain, significant dyspepsia, dysphagia, melena, rectal bleeding, or persistently small caliber stools. Dysuria, pyuria, hematuria, frequency, nocturia or polyuria are denied. Change in hair, skin, nails denied. No bowel changes of constipation or diarrhea. No intolerance to heat or cold.     Objective:   Physical Exam  Pertinent or positive findings include: He has classic click at the apex.  There is atrophy of the left testicle.  There is a flexion contracture the fifth right digit.  He has crepitus of his knees.  General appearance is one of good health and nourishment w/o distress. Eyes: No conjunctival inflammation or scleral icterus is present. Oral exam: Dental hygiene is good; lips and gums are healthy appearing.There is no oropharyngeal erythema or exudate noted.  Ears normal; hearing normal. Heart:  Normal rate and regular rhythm. S1 and S2 normal without gallop, murmur,  rub or other extra sounds   Lungs:Chest clear to auscultation; no wheezes, rhonchi,rales ,or rubs present.No increased work of breathing.  Abdomen: bowel sounds normal, soft and non-tender without masses, organomegaly or hernias noted.  No guarding or rebound .    Musculoskeletal: Able to lie flat and sit up without help. Negative straight leg raising bilaterally. Gait normal Skin:Warm & dry.  Intact without suspicious lesions or rashes ; no jaundice or tenting Lymphatic: No lymphadenopathy is noted about the head, neck, axilla, or inguinal areas.  Prostate normal.             Assessment & Plan:  #1 comprehensive physical exam; no acute findings  Plan: see Orders  & Recommendations

## 2015-01-16 NOTE — Progress Notes (Signed)
Pre visit review using our clinic review tool, if applicable. No additional management support is needed unless otherwise documented below in the visit note. 

## 2015-01-16 NOTE — Patient Instructions (Signed)
  Your next office appointment will be determined based upon review of your pending labs. Those written interpretation of the lab results and instructions will be transmitted to you by My Chart  Critical results will be called.   Followup as needed for any active or acute issue. Please report any significant change in your symptoms. 

## 2015-01-17 ENCOUNTER — Other Ambulatory Visit (INDEPENDENT_AMBULATORY_CARE_PROVIDER_SITE_OTHER): Payer: BLUE CROSS/BLUE SHIELD

## 2015-01-17 DIAGNOSIS — Z0189 Encounter for other specified special examinations: Secondary | ICD-10-CM

## 2015-01-17 DIAGNOSIS — Z0000 Encounter for general adult medical examination without abnormal findings: Secondary | ICD-10-CM

## 2015-01-17 LAB — CBC WITH DIFFERENTIAL/PLATELET
BASOS PCT: 0.6 % (ref 0.0–3.0)
Basophils Absolute: 0 10*3/uL (ref 0.0–0.1)
EOS ABS: 0.2 10*3/uL (ref 0.0–0.7)
EOS PCT: 3.5 % (ref 0.0–5.0)
HCT: 46 % (ref 39.0–52.0)
Hemoglobin: 15.6 g/dL (ref 13.0–17.0)
LYMPHS PCT: 46.6 % — AB (ref 12.0–46.0)
Lymphs Abs: 2.9 10*3/uL (ref 0.7–4.0)
MCHC: 33.9 g/dL (ref 30.0–36.0)
MCV: 94.3 fl (ref 78.0–100.0)
MONOS PCT: 7.7 % (ref 3.0–12.0)
Monocytes Absolute: 0.5 10*3/uL (ref 0.1–1.0)
NEUTROS ABS: 2.6 10*3/uL (ref 1.4–7.7)
NEUTROS PCT: 41.6 % — AB (ref 43.0–77.0)
Platelets: 239 10*3/uL (ref 150.0–400.0)
RBC: 4.88 Mil/uL (ref 4.22–5.81)
RDW: 12.2 % (ref 11.5–15.5)
WBC: 6.2 10*3/uL (ref 4.0–10.5)

## 2015-01-17 LAB — LIPID PANEL
CHOLESTEROL: 236 mg/dL — AB (ref 0–200)
HDL: 58.4 mg/dL (ref >39.00)
LDL CALC: 160 mg/dL — AB (ref 0–99)
NonHDL: 177.6
Total CHOL/HDL Ratio: 4
Triglycerides: 88 mg/dL (ref 0.0–149.0)
VLDL: 17.6 mg/dL (ref 0.0–40.0)

## 2015-01-17 LAB — HEPATIC FUNCTION PANEL
ALT: 17 U/L (ref 0–53)
AST: 16 U/L (ref 0–37)
Albumin: 4.3 g/dL (ref 3.5–5.2)
Alkaline Phosphatase: 38 U/L — ABNORMAL LOW (ref 39–117)
Bilirubin, Direct: 0.1 mg/dL (ref 0.0–0.3)
Total Bilirubin: 0.4 mg/dL (ref 0.2–1.2)
Total Protein: 7.2 g/dL (ref 6.0–8.3)

## 2015-01-17 LAB — BASIC METABOLIC PANEL
BUN: 20 mg/dL (ref 6–23)
CO2: 26 mEq/L (ref 19–32)
CREATININE: 1.1 mg/dL (ref 0.40–1.50)
Calcium: 9.2 mg/dL (ref 8.4–10.5)
Chloride: 106 mEq/L (ref 96–112)
GFR: 74.59 mL/min (ref >60.00)
Glucose, Bld: 96 mg/dL (ref 70–99)
Potassium: 3.7 mEq/L (ref 3.5–5.1)
Sodium: 139 mEq/L (ref 135–145)

## 2015-01-17 LAB — TSH: TSH: 3.91 u[IU]/mL (ref 0.35–4.50)

## 2015-08-13 ENCOUNTER — Telehealth: Payer: Self-pay | Admitting: Internal Medicine

## 2015-08-13 NOTE — Telephone Encounter (Signed)
Left vm for pt to callback 

## 2015-08-13 NOTE — Telephone Encounter (Signed)
Pt request to transfer from Dr. Linna Darner to Dr. Camila Li. Offer PCP that taking [facility] pt but he request to ask Dr. Camila Li. Please advise.

## 2015-08-13 NOTE — Telephone Encounter (Signed)
Unfortunately, I'm not able to accept any more new patients at this time.  I'm sorry! Thank you!  

## 2015-08-14 ENCOUNTER — Encounter: Payer: Self-pay | Admitting: Internal Medicine

## 2016-01-29 ENCOUNTER — Encounter: Payer: BLUE CROSS/BLUE SHIELD | Admitting: Family Medicine

## 2016-02-26 ENCOUNTER — Ambulatory Visit (INDEPENDENT_AMBULATORY_CARE_PROVIDER_SITE_OTHER): Payer: BLUE CROSS/BLUE SHIELD | Admitting: Family Medicine

## 2016-02-26 ENCOUNTER — Encounter: Payer: Self-pay | Admitting: Family Medicine

## 2016-02-26 VITALS — BP 136/86 | HR 64 | Temp 98.9°F | Ht 70.0 in | Wt 185.4 lb

## 2016-02-26 DIAGNOSIS — Z0000 Encounter for general adult medical examination without abnormal findings: Secondary | ICD-10-CM | POA: Diagnosis not present

## 2016-02-26 DIAGNOSIS — I868 Varicose veins of other specified sites: Secondary | ICD-10-CM

## 2016-02-26 DIAGNOSIS — Z13220 Encounter for screening for lipoid disorders: Secondary | ICD-10-CM

## 2016-02-26 DIAGNOSIS — Z130 Encounter for screening for diseases of the blood and blood-forming organs and certain disorders involving the immune mechanism: Secondary | ICD-10-CM

## 2016-02-26 DIAGNOSIS — R030 Elevated blood-pressure reading, without diagnosis of hypertension: Secondary | ICD-10-CM

## 2016-02-26 DIAGNOSIS — Z131 Encounter for screening for diabetes mellitus: Secondary | ICD-10-CM | POA: Diagnosis not present

## 2016-02-26 DIAGNOSIS — Z119 Encounter for screening for infectious and parasitic diseases, unspecified: Secondary | ICD-10-CM

## 2016-02-26 DIAGNOSIS — I8390 Asymptomatic varicose veins of unspecified lower extremity: Secondary | ICD-10-CM

## 2016-02-26 NOTE — Progress Notes (Signed)
Pre visit review using our clinic review tool, if applicable. No additional management support is needed unless otherwise documented below in the visit note. 

## 2016-02-26 NOTE — Progress Notes (Signed)
[place] at [facility] [address], [place], [place] [address] [phone]  Date:  02/26/2016   Name:  Brendan Sharp   DOB:  03-28-1961   MRN:  [id number]  PCP:  Unice Cobble, MD    Chief Complaint: Annual Exam   History of Present Illness:  Brendan Sharp is a 55 y.o. very pleasant male patient who presents with the following:  Former pt of Dr. Unice Cobble who is now retired.  History of IBS, GERD.  Here today for a check up He is generally in good health He still enjoys exercise- he tries to work- out 4x a week.  He had a stress test and nuclear myoview in 205 He does check his BP at home- it may run 131- 140/ 85-90 He has never been on treatment for HTN He did take lipitor in the past but changed to fish oil  His wife notes just occasional snoring.  He had a right knee scope in May and his knee is doing great He ate a couple of hours ago.  Discussed PSA- he declines this test.  He has noted a few varicose veins on his legs- they are not painful, just unsightly   He is married, has a 53 yo son who is taking the bar exam in about 2 weeks- the family is hoping for a good result!  He works for [employer] as a Psychologist, prison and probation services Readings from Last 3 Encounters:  02/26/16 185 lb 6.4 oz (84.097 kg)  01/16/15 181 lb 1.3 oz (82.137 kg)  01/03/15 180 lb 6 oz (81.818 kg)   BP Readings from Last 3 Encounters:  02/26/16 136/86  01/16/15 120/82  01/03/15 135/88     Patient Active Problem List   Diagnosis Date Noted  . DOE (dyspnea on exertion) 02/21/2014  . Nonspecific abnormal electrocardiogram (ECG) (EKG) 12/01/2011  . History of gastroesophageal reflux (GERD) 08/17/2011  . Dyslipidemia 06/01/2011  . Irritable bowel syndrome (IBS) 06/01/2011    Past Medical History  Diagnosis Date  . IBS (irritable bowel syndrome)     Dr Sharlett Iles  . Hyperlipemia   . Esophageal reflux     no Endo to date  . Abnormal  serum level of alkaline phosphatase 10/23/11    37  . Arthritis   . Chondromalacia of right patella     Past Surgical History  Procedure Laterality Date  . Tonsillectomy and adenoidectomy    . Colonoscopy  2013    for IBS, Dr Sharlett Iles  . Hydrocele       surgery @ age 80  . Knee arthroscopy with medial menisectomy Right 01/03/2015    Procedure: RIGHT KNEE ARTHROSCOPY CHONDROPLASTY WITH MEDIAL MENISCECTOMY;  Surgeon: Kathryne Hitch, MD;  Location: [facility];  Service: Orthopedics;  Laterality: Right;  . Chondroplasty Right 01/03/2015    Procedure: CHONDROPLASTY;  Surgeon: Kathryne Hitch, MD;  Location: [facility] [place];  Service: Orthopedics;  Laterality: Right;    Social History  Substance Use Topics  . Smoking status: Never Smoker   . Smokeless tobacco: Never Used  . Alcohol Use: Yes     Comment: 2-3 /week    Family History  Problem Relation Age of Onset  . Hyperlipidemia Mother   . Hyperlipidemia Father   . Hypertension Father   . Colon cancer Neg Hx   . Diabetes Neg Hx   . Stroke Neg Hx   . Heart disease Neg  Hx   . Cancer Neg Hx     Allergies  Allergen Reactions  . Epinephrine     Tachycardia post shot @ Dentist ( he is unsure whether it was epinephrine or Lidocaine)    Medication list has been reviewed and updated.  Current Outpatient Prescriptions on File Prior to Visit  Medication Sig Dispense Refill  . aspirin EC 81 MG tablet Take 81 mg by mouth daily.    . Omega-3 Fatty Acids (FISH OIL BURP-LESS) 1000 MG CAPS Take by mouth.     No current facility-administered medications on file prior to visit.    Review of Systems:  As per HPI- otherwise negative.   Physical Examination: Filed Vitals:   02/26/16 1511  BP: 136/86  Pulse: 64  Temp: 98.9 F (37.2 C)   Filed Vitals:   02/26/16 1511  Height: 5\' 10"  (1.778 m)  Weight: 185 lb 6.4 oz (84.097 kg)   Body mass index is 26.6 kg/(m^2). Ideal Body Weight: Weight in (lb) to have  BMI = 25: 173.9  GEN: WDWN, NAD, Non-toxic, A & O x 3, looks well HEENT: Atraumatic, Normocephalic. Neck supple. No masses, No LAD.  Bilateral TM wnl, oropharynx normal.  PEERL,EOMI.   Ears and Nose: No external deformity. CV: RRR, No M/G/R. No JVD. No thrill. No extra heart sounds. PULM: CTA B, no wheezes, crackles, rhonchi. No retractions. No resp. distress. No accessory muscle use. ABD: S, NT, ND. No rebound. No HSM. EXTR: No c/c/e NEURO Normal gait.  PSYCH: Normally interactive. Conversant. Not depressed or anxious appearing.  Calm demeanor.  Few spider and varicose veins on legs and ankles bilaterally    Assessment and Plan: Physical exam  Screening for deficiency anemia - Plan: CBC  Screening for diabetes mellitus - Plan: Comprehensive metabolic panel, Hemoglobin A1c  Screening for hyperlipidemia - Plan: Lipid panel  Varicose veins  Borderline blood pressure  Screening examination for infectious disease - Plan: Hepatitis C antibody  Healthy man here today for a CPE Labs pending Discussed PSA- he declines. [facility] handout about R/B of this test to look over He will continue to monitor his BP  Signed Lamar Blinks, MD

## 2016-02-26 NOTE — Patient Instructions (Signed)
It was very nice to see you today- I will be in touch with your labs asap Wearing compression socks for your veins is a good idea.  If they are painful or if you decide you want treatment let me know! Right now your blood pressure is borderline- if you are routinely getting numbers higher than 140/90 please let me know

## 2016-02-27 LAB — COMPREHENSIVE METABOLIC PANEL
ALK PHOS: 33 U/L — AB (ref 39–117)
ALT: 22 U/L (ref 0–53)
AST: 20 U/L (ref 0–37)
Albumin: 4.4 g/dL (ref 3.5–5.2)
BILIRUBIN TOTAL: 0.6 mg/dL (ref 0.2–1.2)
BUN: 25 mg/dL — ABNORMAL HIGH (ref 6–23)
CO2: 31 meq/L (ref 19–32)
CREATININE: 1.11 mg/dL (ref 0.40–1.50)
Calcium: 9.4 mg/dL (ref 8.4–10.5)
Chloride: 103 mEq/L (ref 96–112)
GFR: 73.51 mL/min (ref >60.00)
Glucose, Bld: 98 mg/dL (ref 70–99)
Potassium: 4.1 mEq/L (ref 3.5–5.1)
Sodium: 138 mEq/L (ref 135–145)
TOTAL PROTEIN: 7 g/dL (ref 6.0–8.3)

## 2016-02-27 LAB — CBC
HCT: 43.7 % (ref 39.0–52.0)
HEMOGLOBIN: 14.8 g/dL (ref 13.0–17.0)
MCHC: 33.8 g/dL (ref 30.0–36.0)
MCV: 94.8 fl (ref 78.0–100.0)
PLATELETS: 243 10*3/uL (ref 150.0–400.0)
RBC: 4.61 Mil/uL (ref 4.22–5.81)
RDW: 12.9 % (ref 11.5–15.5)
WBC: 6.1 10*3/uL (ref 4.0–10.5)

## 2016-02-27 LAB — LIPID PANEL
Cholesterol: 239 mg/dL — ABNORMAL HIGH (ref 0–200)
HDL: 58.5 mg/dL (ref >39.00)
LDL Cholesterol: 151 mg/dL — ABNORMAL HIGH (ref 0–99)
NonHDL: 180.51
Total CHOL/HDL Ratio: 4
Triglycerides: 149 mg/dL (ref 0.0–149.0)
VLDL: 29.8 mg/dL (ref 0.0–40.0)

## 2016-02-27 LAB — HEPATITIS C ANTIBODY: HCV AB: NEGATIVE

## 2016-02-27 LAB — HEMOGLOBIN A1C: Hgb A1c MFr Bld: 5.4 % (ref 4.6–6.5)

## 2016-04-04 ENCOUNTER — Emergency Department (HOSPITAL_BASED_OUTPATIENT_CLINIC_OR_DEPARTMENT_OTHER)
Admission: EM | Admit: 2016-04-04 | Discharge: 2016-04-04 | Disposition: A | Discharge status: Home / Self Care | Payer: BLUE CROSS/BLUE SHIELD | Attending: Emergency Medicine | Admitting: Emergency Medicine

## 2016-04-04 ENCOUNTER — Encounter (HOSPITAL_BASED_OUTPATIENT_CLINIC_OR_DEPARTMENT_OTHER): Payer: Self-pay | Admitting: Emergency Medicine

## 2016-04-04 DIAGNOSIS — T63441A Toxic effect of venom of bees, accidental (unintentional), initial encounter: Secondary | ICD-10-CM | POA: Insufficient documentation

## 2016-04-04 MED ORDER — PREDNISONE 20 MG PO TABS
40.0000 mg | ORAL_TABLET | Freq: Once | ORAL | Status: AC
  Administered 2016-04-04: 40 mg via ORAL
  Filled 2016-04-04: qty 2

## 2016-04-04 MED ORDER — DIPHENHYDRAMINE HCL 25 MG PO CAPS
25.0000 mg | ORAL_CAPSULE | Freq: Once | ORAL | Status: AC
  Administered 2016-04-04: 25 mg via ORAL
  Filled 2016-04-04: qty 1

## 2016-04-04 MED ORDER — PREDNISONE 10 MG PO TABS: 20.0000 mg | ORAL_TABLET | Freq: Every day | ORAL | 0 refills | Status: DC

## 2016-04-04 MED ORDER — FAMOTIDINE 20 MG PO TABS: 20.0000 mg | ORAL_TABLET | Freq: Two times a day (BID) | ORAL | 0 refills | Status: DC

## 2016-04-04 MED ORDER — FAMOTIDINE 20 MG PO TABS
20.0000 mg | ORAL_TABLET | Freq: Once | ORAL | Status: AC
  Administered 2016-04-04: 20 mg via ORAL
  Filled 2016-04-04: qty 1

## 2016-04-04 MED ORDER — DIPHENHYDRAMINE HCL 25 MG PO TABS: 25.0000 mg | ORAL_TABLET | Freq: Four times a day (QID) | ORAL | 0 refills | Status: DC

## 2016-04-04 NOTE — Discharge Instructions (Signed)
It appears that you are having a local reaction to the wasp stings. Take the medication as needed for itching and allergic reaction. Apply cool compresses to the areas. Follow up with your doctor or return here as needed for worsening symptoms.

## 2016-04-04 NOTE — ED Provider Notes (Signed)
[facility] DEPT [facility] Provider Note   CSN: [id number] Arrival date & time: 04/04/16  1801  By signing my name below, I, Ephriam Jenkins, attest that this documentation has been prepared under the direction and in the presence of [facility] NP.  Electronically Signed: Ephriam Jenkins, ED Scribe. 04/04/16. 6:58 PM.  History   Chief Complaint Chief Complaint  Patient presents with  . Insect Bite    HPI HPI Comments: Brendan Sharp is a 55 y.o. male with no pertinent PMHx, who presents to the Emergency Department after being stung by several wasps approximately 45 minutes ago. Pt states he was working on a hill next to his house when he stepped in a wasps nest. Pt reports several raised areas to his legs and one to his chest and back. Pt states he has never been stung by so many wasps before and is concerned for a possible allergic reaction. Pt denies any SOB or throat swelling.   The history is provided by the patient. No language interpreter was used.    Past Medical History:  Diagnosis Date  . Abnormal serum level of alkaline phosphatase 10/23/11   37  . Arthritis   . Chondromalacia of right patella   . Esophageal reflux    no Endo to date  . Hyperlipemia   . IBS (irritable bowel syndrome)    Dr Sharlett Iles    Patient Active Problem List   Diagnosis Date Noted  . DOE (dyspnea on exertion) 02/21/2014  . Nonspecific abnormal electrocardiogram (ECG) (EKG) 12/01/2011  . History of gastroesophageal reflux (GERD) 08/17/2011  . Dyslipidemia 06/01/2011  . Irritable bowel syndrome (IBS) 06/01/2011    Past Surgical History:  Procedure Laterality Date  . CHONDROPLASTY Right 01/03/2015   Procedure: CHONDROPLASTY;  Surgeon: Kathryne Hitch, MD;  Location: [facility];  Service: Orthopedics;  Laterality: Right;  . COLONOSCOPY  2013   for IBS, Dr Sharlett Iles  . hydrocele      surgery @ age 38  . KNEE ARTHROSCOPY WITH MEDIAL MENISECTOMY Right 01/03/2015   Procedure: RIGHT KNEE  ARTHROSCOPY CHONDROPLASTY WITH MEDIAL MENISCECTOMY;  Surgeon: Kathryne Hitch, MD;  Location: [place];  Service: Orthopedics;  Laterality: Right;  . TONSILLECTOMY AND ADENOIDECTOMY       Home Medications    Prior to Admission medications   Medication Sig Start Date End Date Taking? Authorizing Provider  aspirin EC 81 MG tablet Take 81 mg by mouth daily.    Historical Provider, MD  diphenhydrAMINE (BENADRYL) 25 MG tablet Take 1 tablet (25 mg total) by mouth every 6 (six) hours. 04/04/16   Hope Bunnie Pion, NP  famotidine (PEPCID) 20 MG tablet Take 1 tablet (20 mg total) by mouth 2 (two) times daily. 04/04/16   Hope Bunnie Pion, NP  Omega-3 Fatty Acids (FISH OIL BURP-LESS) 1000 MG CAPS Take by mouth.    Historical Provider, MD  predniSONE (DELTASONE) 10 MG tablet Take 2 tablets (20 mg total) by mouth daily. 04/04/16   Hope Bunnie Pion, NP    Family History Family History  Problem Relation Age of Onset  . Hyperlipidemia Mother   . Hyperlipidemia Father   . Hypertension Father   . Colon cancer Neg Hx   . Diabetes Neg Hx   . Stroke Neg Hx   . Heart disease Neg Hx   . Cancer Neg Hx     Social History Social History  Substance Use Topics  . Smoking status: Never Smoker  . Smokeless tobacco: Never Used  .  Alcohol use Yes     Comment: 2-3 /week     Allergies   Epinephrine   Review of Systems Review of Systems  HENT: Negative for trouble swallowing.   Respiratory: Negative for shortness of breath.   Skin: Positive for color change (red ) and wound (wasp stings).  All other systems reviewed and are negative.    Physical Exam Updated Vital Signs BP (!) 149/109 (BP Location: Left Arm)   Pulse (!) 59   Temp 98.2 F (36.8 C) (Oral)   Resp 16   Ht 5\' 10"  (1.778 m)   Wt 82.6 kg   SpO2 100%   BMI 26.11 kg/m   Physical Exam  Constitutional: He is oriented to person, place, and time. He appears well-developed and well-nourished. No distress.  HENT:  Head:  Normocephalic and atraumatic.  Uvula midline. No edema or erythema  Neck: Normal range of motion.  Cardiovascular: Normal rate and regular rhythm.   Pulmonary/Chest: Effort normal and breath sounds normal.  Neurological: He is alert and oriented to person, place, and time.  Skin: Skin is warm and dry. He is not diaphoretic.  Multiple stings to the lower extremities, chest area, mid right back.  Psychiatric: He has a normal mood and affect. Judgment normal.  Nursing note and vitals reviewed.   ED Treatments / Results  DIAGNOSTIC STUDIES: Oxygen Saturation is 100% on RA, normal by my interpretation.  COORDINATION OF CARE: 6:51 PM-Will order Benadryl. Discussed treatment plan with pt at bedside and pt agreed to plan.   Labs (all labs ordered are listed, but only abnormal results are displayed) Labs Reviewed - No data to display   Radiology No results found.  Procedures Procedures (including critical care time)  Medications Ordered in ED Medications  diphenhydrAMINE (BENADRYL) capsule 25 mg (25 mg Oral Given 04/04/16 1927)  famotidine (PEPCID) tablet 20 mg (20 mg Oral Given 04/04/16 1927)  predniSONE (DELTASONE) tablet 40 mg (40 mg Oral Given 04/04/16 1927)    Initial Impression / Assessment and Plan / ED Course  I have reviewed the triage vital signs and the nursing notes.  Clinical Course   Medication and observation,  Re evaluation @ 2000, patient continues to feel well without shortness of breath or difficulty swallowing. Redness surrounding the stings has decreased.   Final Clinical Impressions(s) / ED Diagnoses  55 y.o. male stable for d/c after multiple wasp stings without shortness of breath or swelling of throat. He will take the medications and f/u with his PCP or return here for worsening symptoms.    Final diagnoses:  Bee sting, accidental or unintentional, initial encounter    New Prescriptions Discharge Medication List as of 04/04/2016  8:03 PM    START  taking these medications   Details  diphenhydrAMINE (BENADRYL) 25 MG tablet Take 1 tablet (25 mg total) by mouth every 6 (six) hours., Starting Sat 04/04/2016, Print    famotidine (PEPCID) 20 MG tablet Take 1 tablet (20 mg total) by mouth 2 (two) times daily., Starting Sat 04/04/2016, Print    predniSONE (DELTASONE) 10 MG tablet Take 2 tablets (20 mg total) by mouth daily., Starting Sat 04/04/2016, Print       I personally performed the services described in this documentation, which was scribed in my presence. The recorded information has been reviewed and is accurate.     [place], [place] 04/04/16 2204    Leonard Schwartz, MD 04/05/16 [phone]

## 2016-04-04 NOTE — ED Notes (Signed)
PA at bedside.

## 2016-04-04 NOTE — ED Triage Notes (Signed)
Patient was stung by multiple bees about 15 to 20 minutes ago. Patient has a local redness at the sting sites, denies any SOB or throat swelling

## 2016-04-04 NOTE — ED Notes (Signed)
Pt states he was stung by several ?wasps about 45 min. Ago. No known allergy. Several raised reddened ares noted +itching

## 2016-04-29 ENCOUNTER — Ambulatory Visit (INDEPENDENT_AMBULATORY_CARE_PROVIDER_SITE_OTHER): Payer: BLUE CROSS/BLUE SHIELD | Admitting: Family Medicine

## 2016-04-29 ENCOUNTER — Encounter: Payer: Self-pay | Admitting: Family Medicine

## 2016-04-29 VITALS — BP 132/88 | HR 77 | Temp 98.5°F | Ht 70.0 in | Wt 183.6 lb

## 2016-04-29 DIAGNOSIS — R0683 Snoring: Secondary | ICD-10-CM | POA: Diagnosis not present

## 2016-04-29 DIAGNOSIS — IMO0001 Reserved for inherently not codable concepts without codable children: Secondary | ICD-10-CM

## 2016-04-29 DIAGNOSIS — R030 Elevated blood-pressure reading, without diagnosis of hypertension: Secondary | ICD-10-CM

## 2016-04-29 NOTE — Progress Notes (Signed)
[place] at [facility] [address] [place], [place] [address] [phone] [phone]  Date:  04/29/2016   Name:  Brendan Sharp   DOB:  05-08-1961   MRN:  [id number]  PCP:  Lamar Blinks, MD    Chief Complaint: Elevated Blood Pressure Readings (c/o high blood pressure readings. pt has a list of readings over the past 3 weeks. also c/o snoring. )   History of Present Illness:  Brendan Sharp is a 55 y.o. very pleasant male patient who presents with the following:  Seen by myself in July with the following partial HPI: Former pt of Dr. Unice Cobble who is now retired.  History of IBS, GERD.  Here today for a check up He is generally in good health He still enjoys exercise- he tries to work- out 4x a week.  He had a stress test and nuclear myoview in 205 He does check his BP at home- it may run 131- 140/ 85-90 He has never been on treatment for HTN He did take lipitor in the past but changed to fish oil  Here today with concern about his BP. He went to the ER about a month ago after being stung by multiple wasps.  They noted that his BP was a bit high, suggested that he monitor this and follow-up with me His parents do have HTN. He himself has never been dx with same, never treated for HTN His wafe has noted him soring more at night.  He has never had a sleep test.  He wonders if he might have OSA and if so could this be contributing to elevated BP  He does run, go to the gym and plays soccer  He has been chekcing his BP at home and brings in a log with him today-   He had a normal stress test 2 years ago SBP generally 130- 140s, DBP 80s- 90s.  A few are higher but not many   BP Readings from Last 3 Encounters:  04/29/16 (!) 141/89  04/04/16 (!) 149/109  02/26/16 136/86     Patient Active Problem List   Diagnosis Date Noted  . DOE (dyspnea on exertion) 02/21/2014  . Nonspecific abnormal electrocardiogram (ECG) (EKG) 12/01/2011   . History of gastroesophageal reflux (GERD) 08/17/2011  . Dyslipidemia 06/01/2011  . Irritable bowel syndrome (IBS) 06/01/2011    Past Medical History:  Diagnosis Date  . Abnormal serum level of alkaline phosphatase 10/23/11   37  . Arthritis   . Chondromalacia of right patella   . Esophageal reflux    no Endo to date  . Hyperlipemia   . IBS (irritable bowel syndrome)    Dr Sharlett Iles    Past Surgical History:  Procedure Laterality Date  . CHONDROPLASTY Right 01/03/2015   Procedure: CHONDROPLASTY;  Surgeon: Kathryne Hitch, MD;  Location: [facility];  Service: Orthopedics;  Laterality: Right;  . COLONOSCOPY  2013   for IBS, Dr Sharlett Iles  . hydrocele      surgery @ age 20  . KNEE ARTHROSCOPY WITH MEDIAL MENISECTOMY Right 01/03/2015   Procedure: RIGHT KNEE ARTHROSCOPY CHONDROPLASTY WITH MEDIAL MENISCECTOMY;  Surgeon: Kathryne Hitch, MD;  Location: [facility];  Service: Orthopedics;  Laterality: Right;  . TONSILLECTOMY AND ADENOIDECTOMY      Social History  Substance Use Topics  . Smoking status: Never Smoker  . Smokeless tobacco: Never Used  . Alcohol use Yes     Comment: 2-3 /  week    Family History  Problem Relation Age of Onset  . Hyperlipidemia Mother   . Hyperlipidemia Father   . Hypertension Father   . Colon cancer Neg Hx   . Diabetes Neg Hx   . Stroke Neg Hx   . Heart disease Neg Hx   . Cancer Neg Hx     Allergies  Allergen Reactions  . Epinephrine     Tachycardia post shot @ Dentist ( he is unsure whether it was epinephrine or Lidocaine)    Medication list has been reviewed and updated.  Current Outpatient Prescriptions on File Prior to Visit  Medication Sig Dispense Refill  . aspirin EC 81 MG tablet Take 81 mg by mouth daily.    . diphenhydrAMINE (BENADRYL) 25 MG tablet Take 1 tablet (25 mg total) by mouth every 6 (six) hours. 20 tablet 0  . famotidine (PEPCID) 20 MG tablet Take 1 tablet (20 mg total) by mouth 2 (two) times  daily. 14 tablet 0  . Omega-3 Fatty Acids (FISH OIL BURP-LESS) 1000 MG CAPS Take by mouth.    . predniSONE (DELTASONE) 10 MG tablet Take 2 tablets (20 mg total) by mouth daily. 15 tablet 0   No current facility-administered medications on file prior to visit.     Review of Systems:  As per HPI- otherwise negative. Wt Readings from Last 3 Encounters:  04/29/16 183 lb 9.6 oz (83.3 kg)  04/04/16 182 lb (82.6 kg)  02/26/16 185 lb 6.4 oz (84.1 kg)   No HA, no SOB, no chest pain. He is tolerating his exercise rountine as well as usual No dizziness   Physical Examination: Vitals:   04/29/16 1028  BP: (!) 141/89  Pulse: 77  Temp: 98.5 F (36.9 C)   Vitals:   04/29/16 1028  Weight: 183 lb 9.6 oz (83.3 kg)  Height: 5\' 10"  (1.778 m)   Body mass index is 26.34 kg/m. Ideal Body Weight: Weight in (lb) to have BMI = 25: 173.9  GEN: WDWN, NAD, Non-toxic, A & O x 3, looks well, slight overweight HEENT: Atraumatic, Normocephalic. Neck supple. No masses, No LAD. Ears and Nose: No external deformity. CV: RRR, No M/G/R. No JVD. No thrill. No extra heart sounds. PULM: CTA B, no wheezes, crackles, rhonchi. No retractions. No resp. distress. No accessory muscle use. ABD: S, NT, ND, +BS. No rebound. No HSM. EXTR: No c/c/e NEURO Normal gait.  PSYCH: Normally interactive. Conversant. Not depressed or anxious appearing.  Calm demeanor.   EKG: NSR with rate of 70, no significant change from prior tracings   Assessment and Plan: Elevated blood pressure - Plan: EKG 12-Lead  Snoring - Plan: Ambulatory referral to Neurology  Here today with concern about BP. Discussed starting a low dose of medication, but he would prefer to first evaluate possible OSA which is reasonable.  Will refer him to neurology and he will continue to monitor his BP in the meantime   Signed Lamar Blinks, MD

## 2016-04-29 NOTE — Patient Instructions (Addendum)
Continue checking your BP a few times a week- if you are routinely higher than 145- 150/90s let me know We will arrange for a neurology appointment to have a sleep apnea evaluation.  Let me know if you do not hear about this appt If you do not hear about your neurology appointment soon let me know!

## 2016-06-03 ENCOUNTER — Ambulatory Visit (INDEPENDENT_AMBULATORY_CARE_PROVIDER_SITE_OTHER): Payer: BLUE CROSS/BLUE SHIELD | Admitting: Neurology

## 2016-06-03 ENCOUNTER — Encounter: Payer: Self-pay | Admitting: Neurology

## 2016-06-03 VITALS — BP 136/80 | HR 78 | Resp 16 | Ht 70.0 in | Wt 184.0 lb

## 2016-06-03 DIAGNOSIS — R030 Elevated blood-pressure reading, without diagnosis of hypertension: Secondary | ICD-10-CM

## 2016-06-03 DIAGNOSIS — R0681 Apnea, not elsewhere classified: Secondary | ICD-10-CM | POA: Diagnosis not present

## 2016-06-03 DIAGNOSIS — R0683 Snoring: Secondary | ICD-10-CM

## 2016-06-03 DIAGNOSIS — R400 Somnolence: Secondary | ICD-10-CM

## 2016-06-03 DIAGNOSIS — G478 Other sleep disorders: Secondary | ICD-10-CM

## 2016-06-03 NOTE — Progress Notes (Signed)
Subjective:    Patient ID: Brendan Sharp is a 55 y.o. male.  HPI      Star Age, MD, PhD [facility] [address] [place], [place] [address]  Dear Dr. Lorelei Pont,   I saw your patient, Brendan Sharp, upon your kind request in my neurologic clinic today for initial consultation of his sleep disorder, in particular, concern for underlying obstructive sleep apnea. The patient is unaccompanied today. As you know, Brendan Sharp is a 55 year old right-handed gentleman with an underlying medical history of hypertension, IBS, reflux disease, hyperlipidemia and overweight state, who reports snoring and excessive daytime somnolence and recent increase in blood pressure values. I reviewed your office note from 04/29/2016. Per wife's report, he makes moaning sounds in his sleep and also has irregular breathing issues and pauses in his breathing while asleep. His Epworth sleepiness score is 9 out of 24, his fatigue score is 16 out of 63 today. His bedtime is between 11 and 11:30 PM, wakeup time around 6 AM. He denies restless leg symptoms, daily nocturia, or morning headaches. He has had elevated blood pressure values in the past few weeks and has been keeping a log. Morning blood pressure values tend to be higher. He's not on medication as yet. 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 Patient drinks alcohol a couple times per week, up to 3 times a week, coffee usually once per day, tries to drink enough water, exercises regularly including soccer, weightlifting, running. His weight has been stable. He recalls no family history of OSA. Family is in [place]. He had a tonsillectomy secondary to recurrent tonsillitis.  His Past Medical History Is Significant For: Past Medical History:  Diagnosis Date  . Abnormal serum level of alkaline phosphatase 10/23/11   37  .  Arthritis   . Chondromalacia of right patella   . Esophageal reflux    no Endo to date  . Hyperlipemia   . IBS (irritable bowel syndrome)    Dr Sharlett Iles    His Past Surgical History Is Significant For: Past Surgical History:  Procedure Laterality Date  . CHONDROPLASTY Right 01/03/2015   Procedure: CHONDROPLASTY;  Surgeon: Kathryne Hitch, MD;  Location: [facility];  Service: Orthopedics;  Laterality: Right;  . COLONOSCOPY  2013   for IBS, Dr Sharlett Iles  . hydrocele      surgery @ age 69  . KNEE ARTHROSCOPY WITH MEDIAL MENISECTOMY Right 01/03/2015   Procedure: RIGHT KNEE ARTHROSCOPY CHONDROPLASTY WITH MEDIAL MENISCECTOMY;  Surgeon: Kathryne Hitch, MD;  Location: [facility];  Service: Orthopedics;  Laterality: Right;  . TONSILLECTOMY AND ADENOIDECTOMY      His Family History Is Significant For: Family History  Problem Relation Age of Onset  . Hyperlipidemia Mother   . Hyperlipidemia Father   . Hypertension Father   . Heart attack Father   . Colon cancer Neg Hx   . Diabetes Neg Hx   . Stroke Neg Hx   . Heart disease Neg Hx   . Cancer Neg Hx     His Social History Is Significant For: Social History   Social History  . Marital status: Married    Spouse name: N/A  . Number of children: 1  . Years of education: MS   Occupational History  . Engineer  [employer]   Social History Main Topics  .  Smoking status: Never Smoker  . Smokeless tobacco: Never Used  . Alcohol use Yes     Comment: 2-3 /week  . Drug use: No  . Sexual activity: Not Asked   Other Topics Concern  . None   Social History Narrative   Caffeine daily     His Allergies Are:  Allergies  Allergen Reactions  . Epinephrine     Tachycardia post shot @ [facility] ( he is unsure whether it was epinephrine or Lidocaine)  :   His Current Medications Are:  Outpatient Encounter Prescriptions as of 06/03/2016  Medication Sig  . aspirin EC 81 MG tablet Take 81 mg by mouth  daily.  . Omega-3 Fatty Acids (FISH OIL BURP-LESS) 1000 MG CAPS Take by mouth.   No facility-administered encounter medications on file as of 06/03/2016.   :  Review of Systems:  Out of a complete 14 point review of systems, all are reviewed and negative with the exception of these symptoms as listed below: Review of Systems  Neurological:       Some trouble falling and staying asleep, snoring, wife has witnessed "different breathing patterns during his sleep". Wife has reported that patient moans in his sleep.  Morning hypertension   Epworth Sleepiness Scale 0= would never doze 1= slight chance of dozing 2= moderate chance of dozing 3= high chance of dozing  Sitting and reading:1 Watching TV:1 Sitting inactive in a public place (ex. Theater or meeting)1 As a passenger in a car for an hour without a break:3 Lying down to rest in the afternoon:3 Sitting and talking to someone:0 Sitting quietly after lunch (no alcohol):0 In a car, while stopped in traffic:0 Total:9   Objective:  Neurologic Exam  Physical Exam Physical Examination:   Vitals:   06/03/16 1539  BP: 136/80  Pulse: 78  Resp: 16    General Examination: The patient is a very pleasant 55 y.o. male in no acute distress. He appears well-developed and well-nourished and very well groomed.   HEENT: Normocephalic, atraumatic, pupils are equal, round and reactive to light and accommodation. Funduscopic exam is normal with sharp disc margins noted. Extraocular tracking is good without limitation to gaze excursion or nystagmus noted. Normal smooth pursuit is noted. Hearing is grossly intact. Tympanic membranes are clear bilaterally. Face is symmetric with normal facial animation and normal facial sensation. Speech is clear with no dysarthria noted. There is no hypophonia. There is no lip, neck/head, jaw or voice tremor. Neck is supple with full range of passive and active motion. There are no carotid bruits on auscultation.  Oropharynx exam reveals: mild mouth dryness, good dental hygiene and mild airway crowding, due to mildly redundant soft palate and longer uvula, otherwise unremarkable airway, normal tone, absent tonsils. Mallampati is class II. Tongue protrudes centrally and palate elevates symmetrically. Neck size is 16.25 inches. He has a Mild overbite.   Chest: Clear to auscultation without wheezing, rhonchi or crackles noted.  Heart: S1+S2+0, regular and normal without murmurs, rubs or gallops noted.   Abdomen: Soft, non-tender and non-distended with normal bowel sounds appreciated on auscultation.  Extremities: There is no pitting edema in the distal lower extremities bilaterally. Pedal pulses are intact.  Skin: Warm and dry without trophic changes noted. There are mild varicose veins in the distal lower extremities, left more than right.  Musculoskeletal: exam reveals no obvious joint deformities, tenderness or joint swelling or erythema.   Neurologically:   Mental status: The patient is awake, alert and oriented in  all 4 spheres. His immediate and remote memory, attention, language skills and fund of knowledge are appropriate. There is no evidence of aphasia, agnosia, apraxia or anomia. Speech is clear with normal prosody and enunciation. Thought process is linear. Mood is normal and affect is normal.  Cranial nerves II - XII are as described above under HEENT exam. In addition: shoulder shrug is normal with equal shoulder height noted. Motor exam: Normal bulk, strength and tone is noted. There is no drift, tremor or rebound. Romberg is negative. Reflexes are 2+ throughout. Babinski: Toes are flexor bilaterally. Fine motor skills and coordination: intact with normal finger taps, normal hand movements, normal rapid alternating patting, normal foot taps and normal foot agility.  Cerebellar testing: No dysmetria or intention tremor on finger to nose testing. Heel to shin is unremarkable bilaterally. There is  no truncal or gait ataxia.  Sensory exam: intact to light touch, pinprick, vibration, temperature sense in the upper and lower extremities.  Gait, station and balance: He stands easily. No veering to one side is noted. No leaning to one side is noted. Posture is age-appropriate and stance is narrow based. Gait shows normal stride length and normal pace. No problems turning are noted. Tandem walk is unremarkable.  Assessment and Plan:   In summary, Brendan Sharp is a very pleasant 55 y.o.-year old male with an underlying medical history of hypertension, IBS, reflux disease, hyperlipidemia and overweight state, whose history and physical exam are concerning for obstructive sleep apnea (OSA). I had a long chat with the patient about my findings and the diagnosis of OSA, its prognosis and treatment options. We talked about medical treatments, surgical interventions and non-pharmacological approaches. I explained in particular the risks and ramifications of untreated moderate to severe OSA, especially with respect to developing cardiovascular disease down the Road, including congestive heart failure, difficult to treat hypertension, cardiac arrhythmias, or stroke. Even type 2 diabetes has, in part, been linked to untreated OSA. Symptoms of untreated OSA include daytime sleepiness, memory problems, mood irritability and mood disorder such as depression and anxiety, lack of energy, as well as recurrent headaches, especially morning headaches. We talked about trying to maintain a healthy lifestyle in general, as well as the importance of weight control. I encouraged the patient to eat healthy, exercise daily and keep well hydrated, to keep a scheduled bedtime and wake time routine, to not skip any meals and eat healthy snacks in between meals. I advised the patient not to drive when feeling sleepy. I recommended the following at this time: sleep study with potential positive airway pressure titration. (We will  score hypopneas at 3% and split the sleep study into diagnostic and treatment portion, if the estimated. 2 hour AHI is >15/h).   I explained the sleep test procedure to the patient and also outlined possible surgical and non-surgical treatment options of OSA, including the use of a custom-made dental device (which would require a referral to a specialist dentist or oral surgeon), upper airway surgical options, such as pillar implants, radiofrequency surgery, tongue base surgery, and UPPP (which would involve a referral to an ENT surgeon). Rarely, jaw surgery such as mandibular advancement may be considered.  I also explained the CPAP treatment option to the patient, who indicated that he would be willing to try CPAP if the need arises. I explained the importance of being compliant with PAP treatment, not only for insurance purposes but primarily to improve His symptoms, and for the patient's long term health benefit, including  to reduce His cardiovascular risks. I answered all his questions today and the patient was in agreement. I would like to see him back after the sleep study is completed and encouraged him to call with any interim questions, concerns, problems or updates.   Thank you very much for allowing me to participate in the care of this nice patient. If I can be of any further assistance to you please do not hesitate to call me at [phone].  Sincerely,   Star Age, MD, PhD

## 2016-06-03 NOTE — Patient Instructions (Signed)
Based on your symptoms and your exam I believe you are at risk for obstructive sleep apnea or OSA, and I think we should proceed with a sleep study to determine whether you do or do not have OSA and how severe it is. If you have more than mild OSA, I want you to consider treatment with CPAP. Please remember, the risks and ramifications of moderate to severe obstructive sleep apnea or OSA are: Cardiovascular disease, including congestive heart failure, stroke, difficult to control hypertension, arrhythmias, and even type 2 diabetes has been linked to untreated OSA. Sleep apnea causes disruption of sleep and sleep deprivation in most cases, which, in turn, can cause recurrent headaches, problems with memory, mood, concentration, focus, and vigilance. Most people with untreated sleep apnea report excessive daytime sleepiness, which can affect their ability to drive. Please do not drive if you feel sleepy.   I will likely see you back after your sleep study to go over the test results and where to go from there. We will call you after your sleep study to advise about the results (most likely, you will hear from Beverlee Nims, my nurse) and to set up an appointment at the time, as necessary.    Our sleep lab administrative assistant, Dawn or Vaughan Basta will meet with you or call you to schedule your sleep study. If you don't hear back from her by next week please feel free to call her at [phone]. This is her direct line and please leave a message with your phone number to call back if you get the voicemail box. She will call back as soon as possible.

## 2016-07-02 ENCOUNTER — Ambulatory Visit (INDEPENDENT_AMBULATORY_CARE_PROVIDER_SITE_OTHER): Payer: BLUE CROSS/BLUE SHIELD | Admitting: Neurology

## 2016-07-02 DIAGNOSIS — G479 Sleep disorder, unspecified: Secondary | ICD-10-CM

## 2016-07-02 DIAGNOSIS — G4720 Circadian rhythm sleep disorder, unspecified type: Secondary | ICD-10-CM

## 2016-07-02 DIAGNOSIS — G4733 Obstructive sleep apnea (adult) (pediatric): Secondary | ICD-10-CM | POA: Diagnosis not present

## 2016-07-17 NOTE — Procedures (Signed)
PATIENT'S NAME:  Brendan Sharp, Brendan Sharp DOB:      02/19/1961      MR#:    [id number]     DATE OF RECORDING: 07/02/2016 REFERRING M.D.:  Lamar Blinks MD Study Performed:   Baseline Polysomnogram HISTORY: 55 year old right-handed gentleman with an underlying medical history of hypertension, IBS, reflux disease, hyperlipidemia and overweight state, who reports snoring and excessive daytime somnolence and recent increase in blood pressure values. The patient endorsed the Epworth Sleepiness Scale at 9/24 points.   The patient's weight 184 pounds with a height of 70 (inches), resulting in a BMI of 26.2 kg/m2. The patient's neck circumference measured 16.2 inches.  CURRENT MEDICATIONS: Aspirin, Omega 3 Fatty acids,   PROCEDURE:  This is a multichannel digital polysomnogram utilizing the Somnostar 11.2 system.  Electrodes and sensors were applied and monitored per AASM Specifications.   EEG, EOG, Chin and Limb EMG, were sampled at 200 Hz.  ECG, Snore and Nasal Pressure, Thermal Airflow, Respiratory Effort, CPAP Flow and Pressure, Oximetry was sampled at 50 Hz. Digital video and audio were recorded.      BASELINE STUDY  Lights Out was at 22:40 and Lights On at 04:41.  Total recording time (TRT) was 362 minutes, with a total sleep time (TST) of  162 minutes.   The patient's sleep latency was 23.5 minutes.  REM was absent.  The sleep efficiency was 44.8 %, which is markedly reduced.     SLEEP ARCHITECTURE: WASO (Wake after sleep onset) was 114 minutes with severe sleep fragmentation noted.  There were 16.5 minutes in Stage N1, 145.5 minutes Stage N2, 0 minutes Stage N3 and 0 minutes in Stage REM.  The percentage of Stage N1 was 10.2%, Stage N2 was 89.8%, Stage N3 was 0% and Stage R (REM sleep) was 0%.  Audio and video analysis did not show any abnormal or unusual movements, behaviors, phonations or vocalizations.  The patient took 1 bathroom break. Mild intermittent snoring was noted.  The EKG was in keeping with  normal sinus rhythm (NSR).  RESPIRATORY ANALYSIS:  There were a total of 16 respiratory events:  0 obstructive apneas, 0 central apneas and 0 mixed apneas with a total of 0 apneas and an apnea index (AI) of 0 /hour. There were 16 hypopneas with a hypopnea index of 5.9 /hour. The patient also had 0 respiratory event related arousals (RERAs).      The total APNEA/HYPOPNEA INDEX (AHI) was 5.9/hour and the total RESPIRATORY DISTURBANCE INDEX was 5.9 /hour.  0 events occurred in REM sleep and 32 events in NREM. The REM AHI was 0 /hour, versus a non-REM AHI of 5.9. The patient spent 70.5 minutes of total sleep time in the supine position and 92 minutes in non-supine.. The supine AHI was 11.9 versus a non-supine AHI of 1.3.  OXYGEN SATURATION & C02:  The Wake baseline 02 saturation was 96%, with the lowest being 89%. Time spent below 89% saturation equaled 0 minutes.   PERIODIC LIMB MOVEMENTS:   The patient had a total of 0 Periodic Limb Movements.  The Periodic Limb Movement (PLM) index was 0 and the PLM Arousal index was 0/hour.   Post-study, the patient indicated that sleep was worse than usual.  IMPRESSION:  1. Obstructive Sleep Apnea (OSA) 2. Dysfunctions associated with sleep stages or arousal from sleep 3. Repetitive Intrusions of Sleep  RECOMMENDATIONS:  1. This study demonstrates overall mild obstructive sleep apnea, however, in the context of severe sleep fragmentation and poor sleep efficiency and  absence of REM sleep, there likely is an underestimation of his AHI and O2 nadir. Given his sleep related complaints, treatment of his OSA with positive airway pressure is recommended and may be achieved with a trial of autoPAP at home. Other treatment options may include avoidance of supine sleep position along with weight loss, upper airway or jaw surgery in selected patients or the use of an oral appliance in certain patients. ENT evaluation and/or consultation with a maxillofacial surgeon or  dentist may be feasible and some instances.    2. This study shows sleep fragmentation and abnormal sleep stage percentages; these are nonspecific findings and per se do not signify an intrinsic sleep disorder or a cause for the patient's sleep-related symptoms. Causes include (but are not limited to) the first night effect of the sleep study, circadian rhythm disturbances, medication effect or an underlying mood disorder or medical problem.  3. The patient should be cautioned not to drive, work at heights, or operate dangerous or heavy equipment when tired or sleepy. Review and reiteration of good sleep hygiene measures should be pursued with any patient. 4. Please note that untreated obstructive sleep apnea carries additional perioperative morbidity. Patients with significant obstructive sleep apnea should receive perioperative PAP therapy and the surgeons and particularly the anesthesiologist should be informed of the diagnosis and the severity of the sleep disordered breathing. 5. The patient will be seen in follow-up by Dr. Rexene Alberts at [facility] for discussion of the test results and further management strategies. The referring provider will be notified of the test results.   I certify that I have reviewed the entire raw data recording prior to the issuance of this report in accordance with the Standards of Accreditation of the American Academy of Sleep Medicine (AASM)    Star Age, MD, PhD Diplomat, American Board of Psychiatry and Neurology (Neurology and Sleep)

## 2016-07-17 NOTE — Progress Notes (Signed)
Patient referred by Dr. Lorelei Pont, seen by me on 06/03/16, diagnostic PSG on 07/02/16.     Please call and notify the patient that the recent sleep study did confirm the diagnosis of obstructive sleep apnea. OSA is overall mild, but he slept poorly and achieved no REM sleep, therefore, study may have underestimated his OSA. It is worth treating to see if he feels better after treatment and BP may improve etc.  To that end I recommend treatment for this in the form of autoPAP, which means, that we don't have to bring him back for a second sleep study with CPAP, but will let him try an autoPAP machine at home, through a DME company (of his choice, or as per insurance requirement). The DME representative will educate him on how to use the machine, how to put the mask on, etc. I have placed an order in the chart. Please send referral, talk to patient, send report to PCP and referring MD. We will need a FU in sleep clinic for 8 to 10 weeks post-PAP set up, please arrange that as well. Thanks,   Star Age, MD, PhD [facility])

## 2016-07-17 NOTE — Addendum Note (Signed)
Addended by: Star Age on: 07/17/2016 09:45 AM   Modules accepted: Orders

## 2016-07-21 ENCOUNTER — Telehealth: Payer: Self-pay

## 2016-07-21 NOTE — Telephone Encounter (Signed)
-----   Message from Star Age, MD sent at 07/17/2016  9:45 AM EST ----- Patient referred by Dr. Lorelei Pont, seen by me on 06/03/16, diagnostic PSG on 07/02/16.     Please call and notify the patient that the recent sleep study did confirm the diagnosis of obstructive sleep apnea. OSA is overall mild, but he slept poorly and achieved no REM sleep, therefore, study may have underestimated his OSA. It is worth treating to see if he feels better after treatment and BP may improve etc.  To that end I recommend treatment for this in the form of autoPAP, which means, that we don't have to bring him back for a second sleep study with CPAP, but will let him try an autoPAP machine at home, through a DME company (of his choice, or as per insurance requirement). The DME representative will educate him on how to use the machine, how to put the mask on, etc. I have placed an order in the chart. Please send referral, talk to patient, send report to PCP and referring MD. We will need a FU in sleep clinic for 8 to 10 weeks post-PAP set up, please arrange that as well. Thanks,   Star Age, MD, PhD [facility])

## 2016-07-21 NOTE — Telephone Encounter (Signed)
LM for patient to call back with results

## 2016-07-23 NOTE — Telephone Encounter (Signed)
Patient called back and he is aware of results. He would like to come in for f/u and discuss the study in more detail and discuss treatment options. He was unable to take an appointment on Monday. Next available is not until February. I advised that we can keep an eye out for cancellations and call with an appt. He requests 24 hour notice.

## 2016-07-27 NOTE — Telephone Encounter (Signed)
I called pt and offered him a follow up appt for 07/28/2016 at 10:00am with Dr. Rexene Alberts. Pt accepted and will call back if this appt does not work. Pt verbalized understanding of appt date and time.

## 2016-07-28 ENCOUNTER — Encounter: Payer: Self-pay | Admitting: Neurology

## 2016-07-28 ENCOUNTER — Ambulatory Visit (INDEPENDENT_AMBULATORY_CARE_PROVIDER_SITE_OTHER): Payer: BLUE CROSS/BLUE SHIELD | Admitting: Neurology

## 2016-07-28 VITALS — BP 160/100 | HR 68 | Resp 20 | Ht 70.0 in | Wt 182.0 lb

## 2016-07-28 DIAGNOSIS — R030 Elevated blood-pressure reading, without diagnosis of hypertension: Secondary | ICD-10-CM | POA: Diagnosis not present

## 2016-07-28 DIAGNOSIS — G4733 Obstructive sleep apnea (adult) (pediatric): Secondary | ICD-10-CM

## 2016-07-28 NOTE — Patient Instructions (Signed)
Your recent sleep study did not show any significant obstructive sleep apnea. You have overall borderline to mild findings, and more events of sleep apnea was evident when you sleep on your back. For this, CPAP therapy is not necessarily warranted, but the lack of dream sleep may have underestimated you sleep apnea events. I would recommend that we set you up at home with a so called autoPAP machine for treatment of your overall mild obstructive sleep apnea. Your new DME company will check on the insurance part and your co-pay. Alternatively, we may pursue the use of an oral appliance for mild OSA.

## 2016-07-28 NOTE — Progress Notes (Signed)
Subjective:    Patient ID: Brendan Sharp is a 55 y.o. male.  HPI     Interim history:   Mr. Brendan Sharp is a 55 year old right-handed gentleman with an underlying medical history of hypertension, IBS, reflux disease, hyperlipidemia and overweight state, who presents for follow-up consultation of his sleep disturbance, after his recent sleep study. The patient is unaccompanied today. I first met him on 06/03/2016 at the request of his primary care physician, at which time he reported elevated pressure values recently as well as snoring and excessive daytime somnolence. He was also noted to have irregular breathing during sleep per wife's report. I invited him for sleep study. He had a baseline sleep study on 07/02/2016. I went over his test results with him in detail today. Sleep efficiency was markedly reduced at 44.8% with a latency to sleep of 23.5 minutes but wake after sleep onset of 114 minutes with severe sleep fragmentation noted. He had absence of slow-wave sleep and absence of REM sleep. He had intermittent snoring. He had no significant EKG changes. He took one bathroom break. He had a total AHI of 5.9 per hour, supine AHI was 11.9 per hour. Average oxygen saturation was 96%, nadir was 89%. The absence of REM sleep likely underestimated his AHI and O2 nadir. We called him with his test results and I suggested we could proceed with AutoPap therapy at home. He requested a follow-up appointment to discuss further.  Today, 07/28/2016: He reports doing okay, BP still up some, checks infrequently. No new symptoms, does admit that he does not sleep very well at home. Feels that he slept worse of course in the sleep lab. Worries about using AutoPap for CPAP because he does not sleep on his back.   Previously:   06/03/2016: He reports snoring and excessive daytime somnolence and recent increase in blood pressure values. I reviewed your office note from 04/29/2016. Per wife's report, he makes moaning  sounds in his sleep and also has irregular breathing issues and pauses in his breathing while asleep. His Epworth sleepiness score is 9 out of 24, his fatigue score is 16 out of 63 today. His bedtime is between 11 and 11:30 PM, wakeup time around 6 AM. He denies restless leg symptoms, daily nocturia, or morning headaches. He has had elevated blood pressure values in the past few weeks and has been keeping a log. Morning blood pressure values tend to be higher. He's not on medication as yet. 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 Patient drinks alcohol a couple times per week, up to 3 times a week, coffee usually once per day, tries to drink enough water, exercises regularly including soccer, weightlifting, running. His weight has been stable. He recalls no family history of OSA. Family is in [place]. He had a tonsillectomy secondary to recurrent tonsillitis.  His Past Medical History Is Significant For: Past Medical History:  Diagnosis Date  . Abnormal serum level of alkaline phosphatase 10/23/11   37  . Arthritis   . Chondromalacia of right patella   . Esophageal reflux    no Endo to date  . Hyperlipemia   . IBS (irritable bowel syndrome)    Dr Sharlett Iles    His Past Surgical History Is Significant For: Past Surgical History:  Procedure Laterality Date  . CHONDROPLASTY Right 01/03/2015   Procedure: CHONDROPLASTY;  Surgeon: Kathryne Hitch, MD;  Location: [facility];  Service: Orthopedics;  Laterality: Right;  . COLONOSCOPY  2013   for IBS, Dr Sharlett Iles  . hydrocele      surgery @ age 89  . KNEE ARTHROSCOPY WITH MEDIAL MENISECTOMY Right 01/03/2015   Procedure: RIGHT KNEE ARTHROSCOPY CHONDROPLASTY WITH MEDIAL MENISCECTOMY;  Surgeon: Kathryne Hitch, MD;  Location: [facility];  Service: Orthopedics;  Laterality: Right;  . TONSILLECTOMY AND ADENOIDECTOMY      His  Family History Is Significant For: Family History  Problem Relation Age of Onset  . Hyperlipidemia Mother   . Hyperlipidemia Father   . Hypertension Father   . Heart attack Father   . Colon cancer Neg Hx   . Diabetes Neg Hx   . Stroke Neg Hx   . Heart disease Neg Hx   . Cancer Neg Hx     His Social History Is Significant For: Social History   Social History  . Marital status: Married    Spouse name: N/A  . Number of children: 1  . Years of education: MS   Occupational History  . Engineer  [employer]   Social History Main Topics  . Smoking status: Never Smoker  . Smokeless tobacco: Never Used  . Alcohol use Yes     Comment: 2-3 /week  . Drug use: No  . Sexual activity: Not Asked   Other Topics Concern  . None   Social History Narrative   Caffeine daily     His Allergies Are:  Allergies  Allergen Reactions  . Epinephrine     Tachycardia post shot @ Dentist ( he is unsure whether it was epinephrine or Lidocaine)  :   His Current Medications Are:  Outpatient Encounter Prescriptions as of 07/28/2016  Medication Sig  . aspirin EC 81 MG tablet Take 81 mg by mouth daily.  . Omega-3 Fatty Acids (FISH OIL BURP-LESS) 1000 MG CAPS Take by mouth.   No facility-administered encounter medications on file as of 07/28/2016.   :  Review of Systems:  Out of a complete 14 point review of systems, all are reviewed and negative with the exception of these symptoms as listed below: Review of Systems  Neurological:       Pt presents today to discuss his sleep study results and further treatment.    Objective:  Neurologic Exam  Physical Exam Physical Examination:   Vitals:   07/28/16 1013  BP: (!) 160/100  Pulse: 68  Resp: 20    General Examination: The patient is a very pleasant 55 y.o. male in no acute distress. He appears well-developed and well-nourished and very well groomed. Good spirits today, no HA, no blurry vision. BP up some, but no  Sx.  HEENT: Normocephalic, atraumatic, pupils are equal, round and reactive to light and accommodation. Funduscopic exam is normal with sharp disc margins noted. Extraocular tracking is good without limitation to gaze excursion or nystagmus noted. Normal smooth pursuit is noted. Hearing is grossly intact. Face is symmetric with normal facial animation and normal facial sensation. Speech is clear with no dysarthria noted. There is no hypophonia. There is no lip, neck/head, jaw or voice tremor. Neck is supple with full range of passive and active motion. There are no carotid bruits on auscultation. Oropharynx exam reveals: mild mouth dryness, good dental hygiene and mild airway crowding, due to mildly redundant soft palate and longer uvula, otherwise unremarkable airway, normal tone, absent tonsils. Mallampati is class II. Tongue protrudes centrally and palate  elevates symmetrically. Neck size is 16.25 inches. He has a Mild overbite.   Chest: Clear to auscultation without wheezing, rhonchi or crackles noted.  Heart: S1+S2+0, regular and normal without murmurs, rubs or gallops noted.   Abdomen: Soft, non-tender and non-distended with normal bowel sounds appreciated on auscultation.  Extremities: There is no pitting edema in the distal lower extremities bilaterally. Pedal pulses are intact.  Skin: Warm and dry without trophic changes noted. There are mild varicose veins in the distal lower extremities, left more than right, wears knee high copper containing compression socks.  Musculoskeletal: exam reveals no obvious joint deformities, tenderness or joint swelling or erythema.   Neurologically:   Mental status: The patient is awake, alert and oriented in all 4 spheres. His immediate and remote memory, attention, language skills and fund of knowledge are appropriate. There is no evidence of aphasia, agnosia, apraxia or anomia. Speech is clear with normal prosody and enunciation. Thought process is  linear. Mood is normal and affect is normal.  Cranial nerves II - XII are as described above under HEENT exam. In addition: shoulder shrug is normal with equal shoulder height noted. Motor exam: Normal bulk, strength and tone is noted. There is no drift, tremor or rebound. Romberg is negative. Reflexes are 2+ throughout. Fine motor skills and coordination: intact in the UEs and LEs.   Cerebellar testing: No dysmetria or intention tremor on finger to nose testing. Heel to shin is unremarkable bilaterally. There is no truncal or gait ataxia.  Sensory exam: intact to light touch in the upper and lower extremities.  Gait, station and balance: He stands easily. No veering to one side is noted. No leaning to one side is noted. Posture is age-appropriate and stance is narrow based. Gait shows normal stride length and normal pace. No problems turning are noted. Tandem walk is unremarkable.  Assessment and Plan:   In summary, Alberta Bradner is a very pleasant 55 year old male with an underlying medical history of hypertension, IBS, reflux disease, hyperlipidemia and overweight state, who Presents for follow-up consultation of his sleep disorder, after his recent sleep study. His baseline sleep study from 07/02/2016 was limited and that he had poor sleep efficiency, significant sleep fragmentation, absence of slow-wave sleep and absence of REM sleep in particular, likely underestimating his obstructive sleep disordered breathing. Overall, his study showed an AHI of 5.9 per hour, supine AHI of 11.9 per hour. Oxyhemoglobin desaturation nadir was 89%, again, the absence of REM sleep more likely than not underestimates his overall AHI and O2 nadir. I had a long chat with the patient about my findings and the diagnosis of OSA, its prognosis and treatment options. We talked about medical treatments, surgical interventions and non-pharmacological approaches. I explained in particular the risks and ramifications of  untreated moderate to severe OSA, especially with respect to developing cardiovascular disease down the Road, including congestive heart failure, difficult to treat hypertension, cardiac arrhythmias, or stroke. Even type 2 diabetes has, in part, been linked to untreated OSA. Symptoms of untreated OSA include daytime sleepiness, memory problems, mood irritability and mood disorder such as depression and anxiety, lack of energy, as well as recurrent headaches, especially morning headaches. We talked about trying to maintain a healthy lifestyle in general, as well as the importance of weight control. I encouraged the patient to eat healthy, exercise daily and keep well hydrated, to keep a scheduled bedtime and wake time routine, to not skip any meals and eat healthy snacks in between meals.  I advised the patient not to drive when feeling sleepy.  I recommended the following at this time: Trial of AutoPap therapy at home. He continues to have elevated blood pressure values, still under observation with his PCP for this, infrequently checks at home. Blood pressure is a little up today but he has no other symptoms and no neurological or other physical findings to suggest complications from high BP at this time. He is willing to give AutoPap try, I suggested a three-month follow-up to recheck symptoms, see if his blood pressure values are any better and recheck compliance, and residual AHI.  Alternatively, we can consider an oral appliance and a referral to a dentist for this in the future. He is agreeable with the plan. To that end, we will send his AutoPap order to one of the local DME companies. He had no preference. I suggested a 3 month follow-up, sooner as needed. He knows to call us for any interim questions or concerns.  I answered all his questions today and the patient was in agreement.  I spent 25 minutes in total face-to-face time with the patient, more than 50% of which was spent in counseling and  coordination of care, reviewing test results, reviewing medication and discussing or reviewing the diagnosis of OSA, its prognosis and treatment options.

## 2016-10-14 ENCOUNTER — Encounter: Payer: Self-pay | Admitting: Family Medicine

## 2016-10-14 ENCOUNTER — Ambulatory Visit (INDEPENDENT_AMBULATORY_CARE_PROVIDER_SITE_OTHER): Payer: BLUE CROSS/BLUE SHIELD | Admitting: Family Medicine

## 2016-10-14 VITALS — BP 135/91 | HR 63 | Temp 98.1°F | Ht 70.0 in | Wt 181.6 lb

## 2016-10-14 DIAGNOSIS — I10 Essential (primary) hypertension: Secondary | ICD-10-CM | POA: Diagnosis not present

## 2016-10-14 DIAGNOSIS — R200 Anesthesia of skin: Secondary | ICD-10-CM

## 2016-10-14 DIAGNOSIS — R202 Paresthesia of skin: Secondary | ICD-10-CM

## 2016-10-14 MED ORDER — LOSARTAN POTASSIUM 50 MG PO TABS: 50.0000 mg | ORAL_TABLET | Freq: Every day | ORAL | 3 refills | Status: DC

## 2016-10-14 NOTE — Progress Notes (Signed)
[facility] [address], [place], [place] [address] [phone]  Date:  10/14/2016   Name:  Brendan Sharp   DOB:  07/04/1961   MRN:  [id number]  PCP:  Lamar Blinks, MD    Chief Complaint: Muscle Pain (Left and Right calfs) and Hypertension   History of Present Illness:  Brendan Sharp is a 56 y.o. very pleasant male patient who presents with the following:  History of IBS and borderline BP- otherwise he has been generally in good health Last seen by myself in September of last year  We have been monitoring his BP in the office, and he bring in an extensive list of BP readings from home He has several readings that are normal, but also many in the 150s/ 90s.  He is ready to start on a BP medication  Normal EKG 9/17, low risk ETT 8/15  BP Readings from Last 3 Encounters:  10/14/16 (!) 135/91  07/28/16 (!) 160/100  06/03/16 136/80   He also had a sleep study recently - however he really did not sleep well during the test and we are not sure if the results are valid so he has decided not to pursue further treatment at this itme  He also notes a problem in his bilateral calves for a couple of months.  It started as a tingling feeling, and he has also noted an occasional spasm in his lower leg.  However he is not having painful cramps The sx are in his posterior calves- his feet are NOT burning, stinging, feeling numb  He is not aware of any change in his routine or medications   He is not currently playing soccer/ running.  However he does continue to exercise regularly in the gym No other muscles bothering him No systemic sx such as fever, chills, weight loss, rash He does not have any known neurological or vascular issues. No history of DM  Patient Active Problem List   Diagnosis Date Noted  . DOE (dyspnea on exertion) 02/21/2014  . Nonspecific abnormal electrocardiogram (ECG) (EKG) 12/01/2011  . History of  gastroesophageal reflux (GERD) 08/17/2011  . Dyslipidemia 06/01/2011  . Irritable bowel syndrome (IBS) 06/01/2011    Past Medical History:  Diagnosis Date  . Abnormal serum level of alkaline phosphatase 10/23/11   37  . Arthritis   . Chondromalacia of right patella   . Esophageal reflux    no Endo to date  . Hyperlipemia   . IBS (irritable bowel syndrome)    Dr Sharlett Iles    Past Surgical History:  Procedure Laterality Date  . CHONDROPLASTY Right 01/03/2015   Procedure: CHONDROPLASTY;  Surgeon: Kathryne Hitch, MD;  Location: [facility];  Service: Orthopedics;  Laterality: Right;  . COLONOSCOPY  2013   for IBS, Dr Sharlett Iles  . hydrocele      surgery @ age 41  . KNEE ARTHROSCOPY WITH MEDIAL MENISECTOMY Right 01/03/2015   Procedure: RIGHT KNEE ARTHROSCOPY CHONDROPLASTY WITH MEDIAL MENISCECTOMY;  Surgeon: Kathryne Hitch, MD;  Location: [facility];  Service: Orthopedics;  Laterality: Right;  . TONSILLECTOMY AND ADENOIDECTOMY      Social History  Substance Use Topics  . Smoking status: Never Smoker  . Smokeless tobacco: Never Used  . Alcohol use Yes     Comment: 2-3 /week    Family History  Problem Relation Age of Onset  . Hyperlipidemia Mother   . Hyperlipidemia Father   .  Hypertension Father   . Heart attack Father   . Colon cancer Neg Hx   . Diabetes Neg Hx   . Stroke Neg Hx   . Heart disease Neg Hx   . Cancer Neg Hx     Allergies  Allergen Reactions  . Epinephrine     Tachycardia post shot @ Dentist ( he is unsure whether it was epinephrine or Lidocaine)    Medication list has been reviewed and updated.  Current Outpatient Prescriptions on File Prior to Visit  Medication Sig Dispense Refill  . aspirin EC 81 MG tablet Take 81 mg by mouth daily.    . Omega-3 Fatty Acids (FISH OIL BURP-LESS) 1000 MG CAPS Take by mouth.     No current facility-administered medications on file prior to visit.     Review of Systems:  As per HPI-  otherwise negative.   Physical Examination: Vitals:   10/14/16 1543  BP: (!) 135/91  Pulse: 63  Temp: 98.1 F (36.7 C)   Vitals:   10/14/16 1543  Weight: 181 lb 9.6 oz (82.4 kg)  Height: 5\' 10"  (1.778 m)   Body mass index is 26.06 kg/m. Ideal Body Weight: Weight in (lb) to have BMI = 25: 173.9  GEN: WDWN, NAD, Non-toxic, A & O x 3, looks well HEENT: Atraumatic, Normocephalic. Neck supple. No masses, No LAD. Ears and Nose: No external deformity. CV: RRR, No M/G/R. No JVD. No thrill. No extra heart sounds. PULM: CTA B, no wheezes, crackles, rhonchi. No retractions. No resp. distress. No accessory muscle use.Marland Kitchen EXTR: No c/c/e NEURO Normal gait.  PSYCH: Normally interactive. Conversant. Not depressed or anxious appearing.  Calm demeanor.  Normal DP pulse, cap refill and monofilament testing of both feet in clinic today Both knees and calves are normal on exam, no swelling, tenderness, redness, or reduced ROM   EKG 04/2016 He had a stress test in 2015 which was reassuring  Assessment and Plan: Benign essential hypertension - Plan: Comprehensive metabolic panel, losartan (COZAAR) 50 MG tablet  Numbness and tingling of both legs - Plan: Comprehensive metabolic panel, 123456, Folate, CK (Creatine Kinase)  Here today with a couple of concerns He has noted elevated BP and is getting concerned- I agree that treatment would be reasonable at this time Will start him on losartan 25 and he will increase to 50 as needed Labs pending as above in an attempt to find a cause for his leg sx- . Will plan further follow- up pending labs.    Signed Lamar Blinks, MD

## 2016-10-14 NOTE — Progress Notes (Signed)
Pre visit review using our clinic tool,if applicable. No additional management support is needed unless otherwise documented below in the visit note.  

## 2016-10-14 NOTE — Patient Instructions (Signed)
It was very nice to see you today, as always We are going to start you on losartan for your blood pressure.  This is written for 50 mg a day, but you could even start with a 1/2 tablet (25 mg) I would like for your average blood pressure to be 115- 130/ 70- 85.  If a 1/2 pill does not get you to this goal increase to a whole pill  We will get some labs today in an attempt to find any cause for the tingling in your legs- I will be in touch with you regarding these results asap

## 2016-10-15 LAB — COMPREHENSIVE METABOLIC PANEL
ALT: 15 U/L (ref 0–53)
AST: 17 U/L (ref 0–37)
Albumin: 4.4 g/dL (ref 3.5–5.2)
Alkaline Phosphatase: 30 U/L — ABNORMAL LOW (ref 39–117)
BILIRUBIN TOTAL: 0.6 mg/dL (ref 0.2–1.2)
BUN: 21 mg/dL (ref 6–23)
CALCIUM: 9.4 mg/dL (ref 8.4–10.5)
CO2: 28 meq/L (ref 19–32)
Chloride: 105 mEq/L (ref 96–112)
Creatinine, Ser: 1.19 mg/dL (ref 0.40–1.50)
GFR: 67.67 mL/min (ref >60.00)
GLUCOSE: 95 mg/dL (ref 70–99)
POTASSIUM: 4.4 meq/L (ref 3.5–5.1)
Sodium: 139 mEq/L (ref 135–145)
Total Protein: 7 g/dL (ref 6.0–8.3)

## 2016-10-15 LAB — VITAMIN B12: Vitamin B-12: 269 pg/mL (ref 211–911)

## 2016-10-15 LAB — FOLATE: FOLATE: 12.2 ng/mL (ref >5.9)

## 2016-10-15 LAB — CK: CK TOTAL: 148 U/L (ref 7–232)

## 2016-10-19 ENCOUNTER — Encounter: Payer: Self-pay | Admitting: Family Medicine

## 2016-10-19 DIAGNOSIS — R202 Paresthesia of skin: Principal | ICD-10-CM

## 2016-10-19 DIAGNOSIS — R200 Anesthesia of skin: Secondary | ICD-10-CM

## 2016-10-28 ENCOUNTER — Ambulatory Visit: Payer: BLUE CROSS/BLUE SHIELD | Admitting: Neurology

## 2016-11-04 ENCOUNTER — Encounter: Payer: Self-pay | Admitting: Neurology

## 2016-11-05 ENCOUNTER — Telehealth: Payer: Self-pay | Admitting: Neurology

## 2016-11-05 NOTE — Telephone Encounter (Signed)
Pt called said he won't be making any more appt's. Said he was not able to complete sleep test and does not feel it will benefit him to be seen. He did say he called and c/a the appt on 3/14 in January and was unsure why it had not been taken off the schedule. I apologized to him and advised he would rec' a letter in the mail as it had been sent out yesterday and to disregard it. He said ok

## 2016-11-27 ENCOUNTER — Ambulatory Visit (INDEPENDENT_AMBULATORY_CARE_PROVIDER_SITE_OTHER): Payer: BLUE CROSS/BLUE SHIELD | Admitting: Neurology

## 2016-11-27 ENCOUNTER — Encounter: Payer: Self-pay | Admitting: Neurology

## 2016-11-27 VITALS — BP 130/84 | HR 78 | Ht 70.0 in | Wt 181.4 lb

## 2016-11-27 DIAGNOSIS — R202 Paresthesia of skin: Secondary | ICD-10-CM

## 2016-11-27 NOTE — Patient Instructions (Addendum)
1.  NCS/EMG of the lower extremities 2.  Start vitamin B12 1072mcg daily  We will call you with the results

## 2016-11-27 NOTE — Progress Notes (Signed)
[facility] Clinic Note - Initial Visit   Date: 11/27/16  Brendan Sharp MRN: [id number] DOB: 06-18-1961   Dear Dr. Lorelei Pont:  Thank you for your kind referral of Brendan Sharp for consultation of bilateral leg discomfort. Although his history is well known to you, please allow [place] to reiterate it for the purpose of our medical record. The patient was accompanied to the clinic by self.    History of Present Illness: Brendan Sharp is a 56 y.o. right-handed [place] male with hypertension presenting for evaluation of bilateral leg discomfort.    Starting around December, he began having bilateral leg spasm, described as a sensation of "short electrical wires in the leg".  Upon further questioning he states that it feels like there is trembling/quivering of the muscles, but denies seeing abnormal muscle movement.  It is mostly over the calf but also involves his thighs and occasionally his feet.  There no associated back pain, cramping, weakness, numbness/tingling.  It plays soccer and has not noticed that physical exertion makes it worse. The sensation is more annoying and does not prevent him from normal activity.  Discomfort is not worse at night time or with resting.  He went to see his PCP for these symptoms who checked CK, vitamin B12, and folate which was within normal limits - vitamin B12 is low-normal.  Out-side paper records, electronic medical record, and images have been reviewed where available and summarized as:  Lab Results  Component Value Date   TSH 3.91 01/17/2015   Lab Results  Component Value Date   HGBA1C 5.4 02/26/2016   Lab Results  Component Value Date   VITAMINB12 269 10/14/2016   Lab Results  Component Value Date   CKTOTAL 148 10/14/2016     Past Medical History:  Diagnosis Date  . Abnormal serum level of alkaline phosphatase 10/23/11   37  . Arthritis   . Chondromalacia of right patella   . Esophageal reflux    no Endo  to date  . Hyperlipemia   . IBS (irritable bowel syndrome)    Dr Brendan Sharp    Past Surgical History:  Procedure Laterality Date  . CHONDROPLASTY Right 01/03/2015   Procedure: CHONDROPLASTY;  Surgeon: Brendan Hitch, MD;  Location: [facility];  Service: Orthopedics;  Laterality: Right;  . COLONOSCOPY  2013   for IBS, Dr Brendan Sharp  . hydrocele      surgery @ age 42  . KNEE ARTHROSCOPY WITH MEDIAL MENISECTOMY Right 01/03/2015   Procedure: RIGHT KNEE ARTHROSCOPY CHONDROPLASTY WITH MEDIAL MENISCECTOMY;  Surgeon: Brendan Hitch, MD;  Location: [facility];  Service: Orthopedics;  Laterality: Right;  . TONSILLECTOMY AND ADENOIDECTOMY       Medications:  Outpatient Encounter Prescriptions as of 11/27/2016  Medication Sig  . aspirin EC 81 MG tablet Take 81 mg by mouth daily.  Marland Kitchen losartan (COZAAR) 50 MG tablet Take 1 tablet (50 mg total) by mouth daily.  . Omega-3 Fatty Acids (FISH OIL BURP-LESS) 1000 MG CAPS Take by mouth.   No facility-administered encounter medications on file as of 11/27/2016.      Allergies:  Allergies  Allergen Reactions  . Epinephrine     Tachycardia post shot @ Dentist ( he is unsure whether it was epinephrine or Lidocaine)    Family History: Family History  Problem Relation Age of Onset  . Hyperlipidemia Mother   . Hyperlipidemia Father   . Hypertension Father   . Heart attack Father   . Colon  cancer Neg Hx   . Diabetes Neg Hx   . Stroke Neg Hx   . Heart disease Neg Hx   . Cancer Neg Hx     Social History: Social History  Substance Use Topics  . Smoking status: Never Smoker  . Smokeless tobacco: Never Used  . Alcohol use Yes     Comment: 2-3 /week   Social History   Social History Narrative   Caffeine daily    He works as an Art gallery manager.       Review of Systems:  CONSTITUTIONAL: No fevers, chills, night sweats, or weight loss.   EYES: No visual changes or eye pain ENT: No hearing changes.  No history  of nose bleeds.   RESPIRATORY: No cough, wheezing and shortness of breath.   CARDIOVASCULAR: Negative for chest pain, and palpitations.   GI: Negative for abdominal discomfort, blood in stools or black stools.  No recent change in bowel habits.   GU:  No history of incontinence.   MUSCLOSKELETAL: No history of joint pain or swelling.  No myalgias.   SKIN: Negative for lesions, rash, and itching.   HEMATOLOGY/ONCOLOGY: Negative for prolonged bleeding, bruising easily, and swollen nodes.  No history of cancer.   ENDOCRINE: Negative for cold or heat intolerance, polydipsia or goiter.   PSYCH:  No depression or anxiety symptoms.   NEURO: As Above.   Vital Signs:  BP 130/84   Pulse 78   Ht 5\' 10"  (1.778 m)   Wt 181 lb 7 oz (82.3 kg)   SpO2 98%   BMI 26.03 kg/m   General Medical Exam:   General:  Well appearing, comfortable.   Eyes/ENT: see cranial nerve examination.   Neck: No masses appreciated.  Full range of motion without tenderness.  No carotid bruits. Respiratory:  Clear to auscultation, good air entry bilaterally.   Cardiac:  Regular rate and rhythm, no murmur.   Extremities:  No deformities, edema, or skin discoloration.  Skin:  No rashes or lesions.  Neurological Exam: MENTAL STATUS including orientation to time, place, person, recent and remote memory, attention span and concentration, language, and fund of knowledge is normal.  Speech is not dysarthric.  CRANIAL NERVES: II:  No visual field defects.  Unremarkable fundi.   III-IV-VI: Pupils equal round and reactive to light.  Normal conjugate, extra-ocular eye movements in all directions of gaze.  No nystagmus.  No ptosis.   V:  Normal facial sensation.     VII:  Normal facial symmetry and movements.   VIII:  Normal hearing and vestibular function.   IX-X:  Normal palatal movement.   XI:  Normal shoulder shrug and head rotation.   XII:  Normal tongue strength and range of motion, no deviation or fasciculation.  MOTOR:  I do not see any atrophy, fasciculations or abnormal movements of any of the muscles.  No pronator drift.  Tone is normal.    Right Upper Extremity:    Left Upper Extremity:    Deltoid  5/5   Deltoid  5/5   Biceps  5/5   Biceps  5/5   Triceps  5/5   Triceps  5/5   Wrist extensors  5/5   Wrist extensors  5/5   Wrist flexors  5/5   Wrist flexors  5/5   Finger extensors  5/5   Finger extensors  5/5   Finger flexors  5/5   Finger flexors  5/5   Dorsal interossei  5/5   Dorsal  interossei  5/5   Abductor pollicis  5/5   Abductor pollicis  5/5   Tone (Ashworth scale)  0  Tone (Ashworth scale)  0   Right Lower Extremity:    Left Lower Extremity:    Hip flexors  5/5   Hip flexors  5/5   Hip extensors  5/5   Hip extensors  5/5   Knee flexors  5/5   Knee flexors  5/5   Knee extensors  5/5   Knee extensors  5/5   Dorsiflexors  5/5   Dorsiflexors  5/5   Plantarflexors  5/5   Plantarflexors  5/5   Toe extensors  5/5   Toe extensors  5/5   Toe flexors  5/5   Toe flexors  5/5   Tone (Ashworth scale)  0  Tone (Ashworth scale)  0   MSRs:  Right                                                                 Left brachioradialis 2+  brachioradialis 2+  biceps 2+  biceps 2+  triceps 2+  triceps 2+  patellar 2+  patellar 2+  ankle jerk 2+  ankle jerk 2+  Hoffman no  Hoffman no  plantar response down  plantar response down   SENSORY:  Normal and symmetric perception of light touch, pinprick, vibration, and proprioception.    COORDINATION/GAIT: Normal finger-to- nose-finger.  Intact rapid alternating movements bilaterally.  Gait narrow based and stable. Tandem and stressed gait intact.    IMPRESSION: This is a 56 year old gentleman referred for evaluation of bilateral total leg dysesthesias. He describes sensation of quivering of the muscles, without actually seeing muscle twitches or any abnormal movement. There is no associated weakness, cramping, or numbness or tingling. His neurological exam is  entirely normal and nonfocal. I reassured him that I do not see any evidence of anything worrisome causing his symptoms. His his vitamin B 12 level is on the low side of normal, I would recommend that he start vitamin B12 1000 g daily and see if this helps. To further evaluate his symptoms, NCS/EMG of the lower extremities will be ordered, my overall suspicion for finding anything worrisome is very low.  Further, his symptoms are not consistent with restless leg syndrome.   Return to clinic, if symptoms do not improve  The duration of this appointment visit was 35 minutes of face-to-face time with the patient.  Greater than 50% of this time was spent in counseling, explanation of diagnosis, planning of further management, and coordination of care.   Thank you for allowing me to participate in patient's care.  If I can answer any additional questions, I would be pleased to do so.    Sincerely,    Brendan K. Posey Pronto, DO

## 2016-12-15 ENCOUNTER — Ambulatory Visit (INDEPENDENT_AMBULATORY_CARE_PROVIDER_SITE_OTHER): Payer: BLUE CROSS/BLUE SHIELD | Admitting: Neurology

## 2016-12-15 DIAGNOSIS — R202 Paresthesia of skin: Secondary | ICD-10-CM

## 2016-12-15 NOTE — Procedures (Signed)
[facility]  [address]  [place], [place] [address] Tel: [phone] Fax:  [phone] Test Date:  12/15/2016  Patient: Brendan Sharp DOB: 10-29-1960 Physician: Narda Amber, DO  Sex: Male Height: 5\' 10"  Ref Phys: Narda Amber, DO  ID#: [id number] Temp: 32.6C Technician:    Patient Complaints: This is a 56 year-old gentleman referred for evaluation of bilateral leg dysesthesias and muscle twitches.   NCV & EMG Findings: Extensive electrodiagnostic testing of the right lower extremity and additional studies of the left shows:  1. Bilateral sural and superficial peroneal sensory responses are within normal limits. 2. Bilateral peroneal and tibial motor responses are within normal limits. 3. Bilateral tibial H reflex studies are within normal limits. 4. There is no evidence of active or chronic motor axon loss changes affecting any of the tested muscles. Motor unit configuration and recruitment pattern is within normal limits.  Impression: This is a normal study.   In particular, there is no evidence of a generalized sensorimotor polyneuropathy, lumbosacral radiculopathy, or diffuse myopathy affecting the lower extremities.   ___________________________ Narda Amber, DO    Nerve Conduction Studies Anti Sensory Summary Table   Site NR Peak (ms) Norm Peak (ms) P-T Amp (V) Norm P-T Amp  Left Sup Peroneal Anti Sensory (Ant Lat Mall)  12 cm    2.8 <4.6 11.9 >4  Right Sup Peroneal Anti Sensory (Ant Lat Mall)  12 cm    3.0 <4.6 9.7 >4  Left Sural Anti Sensory (Lat Mall)  Calf    3.7 <4.6 14.3 >4  Right Sural Anti Sensory (Lat Mall)  Calf    3.2 <4.6 14.2 >4   Motor Summary Table   Site NR Onset (ms) Norm Onset (ms) O-P Amp (mV) Norm O-P Amp Site1 Site2 Delta-0 (ms) Dist (cm) Vel (m/s) Norm Vel (m/s)  Left Peroneal Motor (Ext Dig Brev)  Ankle    4.1 <6.0 6.5 >2.5 B Fib Ankle 8.0 40.0 50 >40  B Fib    12.1  5.3  Poplt B Fib 1.7 10.0 59 >40  Poplt     13.8  5.0         Right Peroneal Motor (Ext Dig Brev)  Ankle    3.1 <6.0 6.7 >2.5 B Fib Ankle 8.2 44.0 54 >40  B Fib    11.3  5.7  Poplt B Fib 1.7 10.0 59 >40  Poplt    13.0  5.7         Left Tibial Motor (Abd Hall Brev)  Ankle    4.3 <6.0 10.9 >4 Knee Ankle 8.6 41.0 48 >40  Knee    12.9  10.3         Right Tibial Motor (Abd Hall Brev)  Ankle    3.5 <6.0 8.5 >4 Knee Ankle 9.1 41.0 45 >40  Knee    12.6  5.2          H Reflex Studies   NR H-Lat (ms) Lat Norm (ms) L-R H-Lat (ms)  Left Tibial (Gastroc)     35.51 <35 0.00  Right Tibial (Gastroc)     35.51 <35 0.00   EMG   Side Muscle Ins Act Fibs Psw Fasc Number Recrt Dur Dur. Amp Amp. Poly Poly. Comment  Right AntTibialis Nml Nml Nml Nml Nml Nml Nml Nml Nml Nml Nml Nml N/A  Right Gastroc Nml Nml Nml Nml Nml Nml Nml Nml Nml Nml Nml Nml N/A  Right RectFemoris Nml Nml Nml Nml  Nml Nml Nml Nml Nml Nml Nml Nml N/A  Right GluteusMed Nml Nml Nml Nml Nml Nml Nml Nml Nml Nml Nml Nml N/A  Right BicepsFemS Nml Nml Nml Nml Nml Nml Nml Nml Nml Nml Nml Nml N/A  Left BicepsFemS Nml Nml Nml Nml Nml Nml Nml Nml Nml Nml Nml Nml N/A  Left AntTibialis Nml Nml Nml Nml Nml Nml Nml Nml Nml Nml Nml Nml N/A  Left Gastroc Nml Nml Nml Nml Nml Nml Nml Nml Nml Nml Nml Nml N/A  Left RectFemoris Nml Nml Nml Nml Nml Nml Nml Nml Nml Nml Nml Nml N/A  Left GluteusMed Nml Nml Nml Nml Nml Nml Nml Nml Nml Nml Nml Nml N/A      Waveforms:

## 2017-01-08 ENCOUNTER — Ambulatory Visit: Payer: BLUE CROSS/BLUE SHIELD | Admitting: Neurology

## 2017-04-13 NOTE — Progress Notes (Addendum)
[facility] [place], [place], [place] [address] [phone]  Date:  04/14/2017   Name:  Brendan Sharp   DOB:  [birth year]   MRN:  [id number]  PCP:  Brendan Mclean, MD    Chief Complaint: Annual Exam   History of Present Illness:  Brendan Sharp is a 56 y.o. very pleasant male patient who presents with the following:  Here today for a CPE History of dyslipidemia, HTN Last seen by myself in February at which time we started losartan for elevated BP readings at home and in the office:  He has noted elevated BP and is getting concerned- I agree that treatment would be reasonable at this time Will start him on losartan 25 and he will increase to 50 as needed  Flu shot due- pt declines today Last complete labs about a year ago psa testing? Would like to do today Tetanus is UTD Colonoscopy 2013   BP Readings from Last 3 Encounters:  04/14/17 134/88  11/27/16 130/84  10/14/16 (!) 135/91   He is not taking the BP medication really- his BP has seemed to be ok even wihtout meds He did eat today- however he had cabbage rolls.    His father does not have prostate cancer, but he does have BPH.    He has seen neurology to try and figure out the cause of his leg pain- all looked ok.  They did have him start on some vitamin B12 but he is not really sure if this helped him or not   No issues with CP or SOB when he exercises- he likes to play soccer, and will do the eliptical indoors if the weather is bad  Patient Active Problem List   Diagnosis Date Noted  . Benign essential hypertension 10/14/2016  . DOE (dyspnea on exertion) 02/21/2014  . Nonspecific abnormal electrocardiogram (ECG) (EKG) 12/01/2011  . History of gastroesophageal reflux (GERD) 08/17/2011  . Dyslipidemia 06/01/2011  . Irritable bowel syndrome (IBS) 06/01/2011    Past Medical History:  Diagnosis Date  . Abnormal serum level of alkaline  phosphatase 10/23/11   37  . Arthritis   . Chondromalacia of right patella   . Esophageal reflux    no Endo to date  . Hyperlipemia   . IBS (irritable bowel syndrome)    Dr Sharlett Iles    Past Surgical History:  Procedure Laterality Date  . CHONDROPLASTY Right 01/03/2015   Procedure: CHONDROPLASTY;  Surgeon: Kathryne Hitch, MD;  Location: [facility];  Service: Orthopedics;  Laterality: Right;  . COLONOSCOPY  2013   for IBS, Dr Sharlett Iles  . hydrocele      surgery @ age 8  . KNEE ARTHROSCOPY WITH MEDIAL MENISECTOMY Right 01/03/2015   Procedure: RIGHT KNEE ARTHROSCOPY CHONDROPLASTY WITH MEDIAL MENISCECTOMY;  Surgeon: Kathryne Hitch, MD;  Location: [facility];  Service: Orthopedics;  Laterality: Right;  . TONSILLECTOMY AND ADENOIDECTOMY      Social History  Substance Use Topics  . Smoking status: Never Smoker  . Smokeless tobacco: Never Used  . Alcohol use Yes     Comment: 2-3 /week    Family History  Problem Relation Age of Onset  . Hyperlipidemia Mother   . Hyperlipidemia Father   . Hypertension Father   . Heart attack Father   . Colon cancer Neg Hx   . Diabetes Neg Hx   . Stroke Neg Hx   . Heart  disease Neg Hx   . Cancer Neg Hx     Allergies  Allergen Reactions  . Epinephrine     Tachycardia post shot @ Dentist ( he is unsure whether it was epinephrine or Lidocaine)    Medication list has been reviewed and updated.  Current Outpatient Prescriptions on File Prior to Visit  Medication Sig Dispense Refill  . losartan (COZAAR) 50 MG tablet Take 1 tablet (50 mg total) by mouth daily. 90 tablet 3  . Omega-3 Fatty Acids (FISH OIL BURP-LESS) 1000 MG CAPS Take by mouth.    Marland Kitchen aspirin EC 81 MG tablet Take 81 mg by mouth daily.     No current facility-administered medications on file prior to visit.     Review of Systems:  As per HPI- otherwise negative. No CP or SOB with exercise No urinary frequency or nocturia No nausea, vomiting,  diarrhea    Physical Examination: Vitals:   04/14/17 1402  BP: 134/88  Pulse: 63  Temp: 98.5 F (36.9 C)  SpO2: 100%   Vitals:   04/14/17 1402  Weight: 185 lb 3.2 oz (84 kg)  Height: 5' 4.5" (1.638 m)   Body mass index is 31.3 kg/m. Ideal Body Weight: Weight in (lb) to have BMI = 25: 147.6  GEN: WDWN, NAD, Non-toxic, A & O x 3, looks well, fit build HEENT: Atraumatic, Normocephalic. Neck supple. No masses, No LAD. Ears and Nose: No external deformity. CV: RRR, No M/G/R. No JVD. No thrill. No extra heart sounds. PULM: CTA B, no wheezes, crackles, rhonchi. No retractions. No resp. distress. No accessory muscle use. ABD: S, NT, ND, +BS. No rebound. No HSM. EXTR: No c/c/e NEURO Normal gait.  PSYCH: Normally interactive. Conversant. Not depressed or anxious appearing.  Calm demeanor.    Assessment and Plan: Physical exam  Benign essential hypertension  Screening for deficiency anemia - Plan: CBC  Screening for diabetes mellitus - Plan: Comprehensive metabolic panel, Hemoglobin A1c  Screening for hyperlipidemia - Plan: Lipid panel  Screening for prostate cancer - Plan: PSA  B12 deficiency - Plan: Vitamin B12  Here today for a CPE He is doing very well in general- he does mention that his job is more stressful lately and he has occasional difficulty sleeping.  After discussion he does not wish to try any medication for this, but will let me know if this changes Will plan further follow- up pending labs.   Signed Lamar Blinks, MD  Received labs 8/31  Results for orders placed or performed in visit on 04/14/17  CBC  Result Value Ref Range   WBC 5.3 4.0 - 10.5 K/uL   RBC 4.40 4.22 - 5.81 Mil/uL   Platelets 239.0 150.0 - 400.0 K/uL   Hemoglobin 14.4 13.0 - 17.0 g/dL   HCT 43.1 39.0 - 52.0 %   MCV 97.9 78.0 - 100.0 fl   MCHC 33.5 30.0 - 36.0 g/dL   RDW 12.4 11.5 - 15.5 %  Comprehensive metabolic panel  Result Value Ref Range   Sodium 141 135 - 145 mEq/L    Potassium 4.8 3.5 - 5.1 mEq/L   Chloride 106 96 - 112 mEq/L   CO2 29 19 - 32 mEq/L   Glucose, Bld 108 (H) 70 - 99 mg/dL   BUN 20 6 - 23 mg/dL   Creatinine, Ser 1.28 0.40 - 1.50 mg/dL   Total Bilirubin 0.7 0.2 - 1.2 mg/dL   Alkaline Phosphatase 32 (L) 39 - 117 U/L   AST 17  0 - 37 U/L   ALT 18 0 - 53 U/L   Total Protein 6.4 6.0 - 8.3 g/dL   Albumin 4.3 3.5 - 5.2 g/dL   Calcium 9.4 8.4 - 10.5 mg/dL   GFR 62.10 >60.00 mL/min  Lipid panel  Result Value Ref Range   Cholesterol 242 (H) 0 - 200 mg/dL   Triglycerides 193.0 (H) 0.0 - 149.0 mg/dL   HDL 51.10 >39.00 mg/dL   VLDL 38.6 0.0 - 40.0 mg/dL   LDL Cholesterol 152 (H) 0 - 99 mg/dL   Total CHOL/HDL Ratio 5    NonHDL 190.52   Hemoglobin A1c  Result Value Ref Range   Hgb A1c MFr Bld 5.7 4.6 - 6.5 %  PSA  Result Value Ref Range   PSA 0.57 0.10 - 4.00 ng/mL  Vitamin B12  Result Value Ref Range   Vitamin B-12 813 211 - 911 pg/mL

## 2017-04-14 ENCOUNTER — Ambulatory Visit (INDEPENDENT_AMBULATORY_CARE_PROVIDER_SITE_OTHER): Payer: BLUE CROSS/BLUE SHIELD | Admitting: Family Medicine

## 2017-04-14 ENCOUNTER — Encounter: Payer: Self-pay | Admitting: Family Medicine

## 2017-04-14 VITALS — BP 134/88 | HR 63 | Temp 98.5°F | Ht 70.0 in | Wt 185.2 lb

## 2017-04-14 DIAGNOSIS — E538 Deficiency of other specified B group vitamins: Secondary | ICD-10-CM

## 2017-04-14 DIAGNOSIS — I10 Essential (primary) hypertension: Secondary | ICD-10-CM

## 2017-04-14 DIAGNOSIS — Z13220 Encounter for screening for lipoid disorders: Secondary | ICD-10-CM | POA: Diagnosis not present

## 2017-04-14 DIAGNOSIS — Z130 Encounter for screening for diseases of the blood and blood-forming organs and certain disorders involving the immune mechanism: Secondary | ICD-10-CM

## 2017-04-14 DIAGNOSIS — Z131 Encounter for screening for diabetes mellitus: Secondary | ICD-10-CM

## 2017-04-14 DIAGNOSIS — Z0000 Encounter for general adult medical examination without abnormal findings: Secondary | ICD-10-CM | POA: Diagnosis not present

## 2017-04-14 DIAGNOSIS — Z125 Encounter for screening for malignant neoplasm of prostate: Secondary | ICD-10-CM

## 2017-04-14 NOTE — Patient Instructions (Addendum)
It was a pleasure to see you today as always!  I will be in touch with your labs asap Continue to exercise and take good care of yourself.  Your BP looks fine even without your medication today    Health Maintenance, Male A healthy lifestyle and preventive care is important for your health and wellness. Ask your health care provider about what schedule of regular examinations is right for you. What should I know about weight and diet? Eat a Healthy Diet  Eat plenty of vegetables, fruits, whole grains, low-fat dairy products, and lean protein.  Do not eat a lot of foods high in solid fats, added sugars, or salt.  Maintain a Healthy Weight Regular exercise can help you achieve or maintain a healthy weight. You should:  Do at least 150 minutes of exercise each week. The exercise should increase your heart rate and make you sweat (moderate-intensity exercise).  Do strength-training exercises at least twice a week.  Watch Your Levels of Cholesterol and Blood Lipids  Have your blood tested for lipids and cholesterol every 5 years starting at 54 years of age. If you are at high risk for heart disease, you should start having your blood tested when you are 54 years old. You may need to have your cholesterol levels checked more often if: ? Your lipid or cholesterol levels are high. ? You are older than 54 years of age. ? You are at high risk for heart disease.  What should I know about cancer screening? Many types of cancers can be detected early and may often be prevented. Lung Cancer  You should be screened every year for lung cancer if: ? You are a current smoker who has smoked for at least 30 years. ? You are a former smoker who has quit within the past 15 years.  Talk to your health care provider about your screening options, when you should start screening, and how often you should be screened.  Colorectal Cancer  Routine colorectal cancer screening usually begins at 53 years of  age and should be repeated every 5-10 years until you are 54 years old. You may need to be screened more often if early forms of precancerous polyps or small growths are found. Your health care provider may recommend screening at an earlier age if you have risk factors for colon cancer.  Your health care provider may recommend using home test kits to check for hidden blood in the stool.  A small camera at the end of a tube can be used to examine your colon (sigmoidoscopy or colonoscopy). This checks for the earliest forms of colorectal cancer.  Prostate and Testicular Cancer  Depending on your age and overall health, your health care provider may do certain tests to screen for prostate and testicular cancer.  Talk to your health care provider about any symptoms or concerns you have about testicular or prostate cancer.  Skin Cancer  Check your skin from head to toe regularly.  Tell your health care provider about any new moles or changes in moles, especially if: ? There is a change in a mole's size, shape, or color. ? You have a mole that is larger than a pencil eraser.  Always use sunscreen. Apply sunscreen liberally and repeat throughout the day.  Protect yourself by wearing long sleeves, pants, a wide-brimmed hat, and sunglasses when outside.  What should I know about heart disease, diabetes, and high blood pressure?  If you are 51-57 years of age, have  your blood pressure checked every 3-5 years. If you are 40 years of age or older, have your blood pressure checked every year. You should have your blood pressure measured twice-once when you are at a hospital or clinic, and once when you are not at a hospital or clinic. Record the average of the two measurements. To check your blood pressure when you are not at a hospital or clinic, you can use: ? An automated blood pressure machine at a pharmacy. ? A home blood pressure monitor.  Talk to your health care provider about your target  blood pressure.  If you are between 66-78 years old, ask your health care provider if you should take aspirin to prevent heart disease.  Have regular diabetes screenings by checking your fasting blood sugar level. ? If you are at a normal weight and have a low risk for diabetes, have this test once every three years after the age of 67. ? If you are overweight and have a high risk for diabetes, consider being tested at a younger age or more often.  A one-time screening for abdominal aortic aneurysm (AAA) by ultrasound is recommended for men aged 26-75 years who are current or former smokers. What should I know about preventing infection? Hepatitis B If you have a higher risk for hepatitis B, you should be screened for this virus. Talk with your health care provider to find out if you are at risk for hepatitis B infection. Hepatitis C Blood testing is recommended for:  Everyone born from [birth year] through 1963.  Anyone with known risk factors for hepatitis C.  Sexually Transmitted Diseases (STDs)  You should be screened each year for STDs including gonorrhea and chlamydia if: ? You are sexually active and are younger than 54 years of age. ? You are older than 54 years of age and your health care provider tells you that you are at risk for this type of infection. ? Your sexual activity has changed since you were last screened and you are at an increased risk for chlamydia or gonorrhea. Ask your health care provider if you are at risk.  Talk with your health care provider about whether you are at high risk of being infected with HIV. Your health care provider may recommend a prescription medicine to help prevent HIV infection.  What else can I do?  Schedule regular health, dental, and eye exams.  Stay current with your vaccines (immunizations).  Do not use any tobacco products, such as cigarettes, chewing tobacco, and e-cigarettes. If you need help quitting, ask your health care  provider.  Limit alcohol intake to no more than 2 drinks per day. One drink equals 12 ounces of beer, 5 ounces of wine, or 1 ounces of hard liquor.  Do not use street drugs.  Do not share needles.  Ask your health care provider for help if you need support or information about quitting drugs.  Tell your health care provider if you often feel depressed.  Tell your health care provider if you have ever been abused or do not feel safe at home. This information is not intended to replace advice given to you by your health care provider. Make sure you discuss any questions you have with your health care provider. Document Released: 01/30/2008 Document Revised: 04/01/2016 Document Reviewed: 05/07/2015 Elsevier Interactive Patient Education  [employer].

## 2017-04-15 LAB — CBC
HEMATOCRIT: 43.1 % (ref 39.0–52.0)
HEMOGLOBIN: 14.4 g/dL (ref 13.0–17.0)
MCHC: 33.5 g/dL (ref 30.0–36.0)
MCV: 97.9 fl (ref 78.0–100.0)
Platelets: 239 10*3/uL (ref 150.0–400.0)
RBC: 4.4 Mil/uL (ref 4.22–5.81)
RDW: 12.4 % (ref 11.5–15.5)
WBC: 5.3 10*3/uL (ref 4.0–10.5)

## 2017-04-15 LAB — LIPID PANEL
CHOLESTEROL: 242 mg/dL — AB (ref 0–200)
HDL: 51.1 mg/dL (ref >39.00)
LDL CALC: 152 mg/dL — AB (ref 0–99)
NonHDL: 190.52
TRIGLYCERIDES: 193 mg/dL — AB (ref 0.0–149.0)
Total CHOL/HDL Ratio: 5
VLDL: 38.6 mg/dL (ref 0.0–40.0)

## 2017-04-15 LAB — COMPREHENSIVE METABOLIC PANEL
ALBUMIN: 4.3 g/dL (ref 3.5–5.2)
ALK PHOS: 32 U/L — AB (ref 39–117)
ALT: 18 U/L (ref 0–53)
AST: 17 U/L (ref 0–37)
BUN: 20 mg/dL (ref 6–23)
CHLORIDE: 106 meq/L (ref 96–112)
CO2: 29 mEq/L (ref 19–32)
Calcium: 9.4 mg/dL (ref 8.4–10.5)
Creatinine, Ser: 1.28 mg/dL (ref 0.40–1.50)
GFR: 62.1 mL/min (ref >60.00)
Glucose, Bld: 108 mg/dL — ABNORMAL HIGH (ref 70–99)
POTASSIUM: 4.8 meq/L (ref 3.5–5.1)
SODIUM: 141 meq/L (ref 135–145)
TOTAL PROTEIN: 6.4 g/dL (ref 6.0–8.3)
Total Bilirubin: 0.7 mg/dL (ref 0.2–1.2)

## 2017-04-15 LAB — HEMOGLOBIN A1C: Hgb A1c MFr Bld: 5.7 % (ref 4.6–6.5)

## 2017-04-15 LAB — VITAMIN B12: Vitamin B-12: 813 pg/mL (ref 211–911)

## 2017-04-15 LAB — PSA: PSA: 0.57 ng/mL (ref 0.10–4.00)

## 2017-04-16 ENCOUNTER — Encounter: Payer: Self-pay | Admitting: Family Medicine

## 2017-04-27 ENCOUNTER — Encounter: Payer: Self-pay | Admitting: Family Medicine

## 2017-04-27 DIAGNOSIS — E785 Hyperlipidemia, unspecified: Secondary | ICD-10-CM

## 2017-04-29 NOTE — Addendum Note (Signed)
Addended by: Lamar Blinks C on: 04/29/2017 06:17 PM   Modules accepted: Orders

## 2017-08-23 ENCOUNTER — Ambulatory Visit: Payer: BLUE CROSS/BLUE SHIELD | Admitting: Medical

## 2017-08-23 ENCOUNTER — Encounter: Payer: Self-pay | Admitting: Medical

## 2017-08-23 VITALS — BP 124/79 | HR 66 | Temp 98.1°F | Resp 16 | Ht 70.0 in | Wt 183.2 lb

## 2017-08-23 DIAGNOSIS — G4700 Insomnia, unspecified: Secondary | ICD-10-CM

## 2017-08-23 DIAGNOSIS — R059 Cough, unspecified: Secondary | ICD-10-CM

## 2017-08-23 DIAGNOSIS — J029 Acute pharyngitis, unspecified: Secondary | ICD-10-CM

## 2017-08-23 DIAGNOSIS — R05 Cough: Secondary | ICD-10-CM

## 2017-08-23 MED ORDER — FLUTICASONE PROPIONATE 50 MCG/ACT NA SUSP: 2.0000 | Freq: Every day | NASAL | 1 refills | Status: DC

## 2017-08-23 MED ORDER — HYDROCODONE-HOMATROPINE 5-1.5 MG/5ML PO SYRP: 5.0000 mL | ORAL_SOLUTION | Freq: Three times a day (TID) | ORAL | 0 refills | Status: DC | PRN

## 2017-08-23 MED ORDER — AZITHROMYCIN 250 MG PO TABS: ORAL_TABLET | ORAL | 0 refills | Status: DC

## 2017-08-23 NOTE — Progress Notes (Signed)
Subjective:    Patient ID: Brendan Sharp, male    DOB: 1960/12/23, 57 y.o.   MRN: [id number]  HPI  Pt in with some stuffy nose, st and cough for last 3 days. Pt not evaluated for this. Pt is taking some tylenol otc and cough drops. ST is actually getting worse than better. Today worse  pain of 3 days.  No muscle aches. No fever, no chills or sweats.  Pt states around 5 nights ago he has been having some trouble sleeping. No recent sudafed. Ocassional cough at night.  Pt denies any major increase in his baseline stress level. No chronic history of insomnia. He only drinks coffee in morning. Never past 1 pm.     Review of Systems  Constitutional: Negative for chills, fatigue and fever.  HENT: Positive for congestion and sore throat. Negative for dental problem, ear discharge, ear pain and hearing loss.        Mild voice change.  Respiratory: Negative for cough, chest tightness, shortness of breath and wheezing.   Cardiovascular: Negative for chest pain and palpitations.  Gastrointestinal: Negative for abdominal pain, blood in stool, constipation, nausea and vomiting.  Musculoskeletal: Negative for back pain, gait problem, neck pain and neck stiffness.  Skin: Negative for rash.  Neurological: Negative for dizziness, facial asymmetry, speech difficulty, weakness and headaches.  Hematological: Negative for adenopathy. Does not bruise/bleed easily.  Psychiatric/Behavioral: Positive for sleep disturbance. Negative for behavioral problems, confusion and suicidal ideas. The patient is not nervous/anxious.    Past Medical History:  Diagnosis Date  . Abnormal serum level of alkaline phosphatase 10/23/11   37  . Arthritis   . Chondromalacia of right patella   . Esophageal reflux    no Endo to date  . Hyperlipemia   . IBS (irritable bowel syndrome)    Dr Sharlett Iles     Social History   Socioeconomic History  . Marital status: Married    Spouse name: Not on file  . Number of  children: 1  . Years of education: MS  . Highest education level: Not on file  Social Needs  . Financial resource strain: Not on file  . Food insecurity - worry: Not on file  . Food insecurity - inability: Not on file  . Transportation needs - medical: Not on file  . Transportation needs - non-medical: Not on file  Occupational History  . Occupation: Higher education careers adviser: [employer]  Tobacco Use  . Smoking status: Never Smoker  . Smokeless tobacco: Never Used  Substance and Sexual Activity  . Alcohol use: Yes    Comment: 2-3 /week  . Drug use: No  . Sexual activity: Not on file  Other Topics Concern  . Not on file  Social History Narrative   Caffeine daily    He works as an Art gallery manager.   Lives with wife in a 2 story home.    Has one child.    Education: MSE    Past Surgical History:  Procedure Laterality Date  . CHONDROPLASTY Right 01/03/2015   Procedure: CHONDROPLASTY;  Surgeon: Kathryne Hitch, MD;  Location: [facility];  Service: Orthopedics;  Laterality: Right;  . COLONOSCOPY  2013   for IBS, Dr Sharlett Iles  . hydrocele      surgery @ age 57  . KNEE ARTHROSCOPY WITH MEDIAL MENISECTOMY Right 01/03/2015   Procedure: RIGHT KNEE ARTHROSCOPY CHONDROPLASTY WITH MEDIAL MENISCECTOMY;  Surgeon: Kathryne Hitch, MD;  Location: [facility];  Service: Orthopedics;  Laterality: Right;  . TONSILLECTOMY AND ADENOIDECTOMY      Family History  Problem Relation Age of Onset  . Hyperlipidemia Mother   . Hyperlipidemia Father   . Hypertension Father   . Heart attack Father   . Colon cancer Neg Hx   . Diabetes Neg Hx   . Stroke Neg Hx   . Heart disease Neg Hx   . Cancer Neg Hx     Allergies  Allergen Reactions  . Epinephrine     Tachycardia post shot @ Dentist ( he is unsure whether it was epinephrine or Lidocaine)    Current Outpatient Medications on File Prior to Visit  Medication Sig Dispense Refill  . aspirin EC 81 MG tablet  Take 81 mg by mouth daily.    Marland Kitchen losartan (COZAAR) 50 MG tablet Take 1 tablet (50 mg total) by mouth daily. (Patient not taking: Reported on 08/23/2017) 90 tablet 3  . Omega-3 Fatty Acids (FISH OIL BURP-LESS) 1000 MG CAPS Take by mouth.     No current facility-administered medications on file prior to visit.     BP 124/79   Pulse 66   Temp 98.1 F (36.7 C) (Oral)   Resp 16   Ht 5\' 10"  (1.778 m)   Wt 183 lb 3.2 oz (83.1 kg)   SpO2 100%   BMI 26.29 kg/m       Objective:   Physical Exam  General  Mental Status - Alert. General Appearance - Well groomed. Not in acute distress.  Skin Rashes- No Rashes.  HEENT Head- Normal. Ear Auditory Canal - Left- Normal. Right - Normal.Tympanic Membrane- Left- Normal. Right- Normal. Eye Sclera/Conjunctiva- Left- Normal. Right- Normal. Nose & Sinuses Nasal Mucosa- Left-  Boggy and Congested. Right-  Boggy and  Congested.Bilateral maxillary and frontal sinus pressure. Mouth & Throat Lips: Upper Lip- Normal: no dryness, cracking, pallor, cyanosis, or vesicular eruption. Lower Lip-Normal: no dryness, cracking, pallor, cyanosis or vesicular eruption. Buccal Mucosa- Bilateral- No Aphthous ulcers. Oropharynx- No Discharge or Erythema. Tonsils: Characteristics- Bilateral- bright  Erythema. Size/Enlargement- Bilateral- No enlargement. Discharge- bilateral-None.  Neck Neck- Supple. No Masses.   Chest and Lung Exam Auscultation: Breath Sounds:-Clear even and unlabored.  Cardiovascular Auscultation:Rythm- Regular, rate and rhythm. Murmurs & Other Heart Sounds:Ausculatation of the heart reveal- No Murmurs.  Lymphatic Head & Neck General Head & Neck Lymphatics: Bilateral: Description- mild submandibular lymph node enlarged.       Assessment & Plan:  Your throat exam looks suspicious for strep based on extreme bright redness.  Your rapid strep test came back negative but will go ahead and rx azithromycin based on clinical suspicion.  Your  rapid strep test was negative.(flu test also was negative)  For recent insomnia and cough will rx hycodan. But if insomnia persist for more than a week would consider other medication.  Follow up in 7-10 days or as needed

## 2017-08-23 NOTE — Patient Instructions (Addendum)
Your throat exam looks suspicious for strep based on extreme bright redness.  Your rapid strep test came back negative but will go ahead and rx azithromycin based on clinical suspicion.  Your rapid strep test was negative.(flu test also was negative)  For recent insomnia and cough will rx hycodan. But if insomnia persist for more than a week would consider other medication.  Follow up in 7-10 days or as needed

## 2017-09-24 ENCOUNTER — Encounter: Payer: Self-pay | Admitting: Family Medicine

## 2017-09-24 ENCOUNTER — Other Ambulatory Visit (INDEPENDENT_AMBULATORY_CARE_PROVIDER_SITE_OTHER): Payer: BLUE CROSS/BLUE SHIELD

## 2017-09-24 ENCOUNTER — Other Ambulatory Visit: Payer: Self-pay | Admitting: Emergency Medicine

## 2017-09-24 DIAGNOSIS — Z13220 Encounter for screening for lipoid disorders: Secondary | ICD-10-CM

## 2017-09-24 LAB — LIPID PANEL
CHOLESTEROL: 218 mg/dL — AB (ref 0–200)
HDL: 48.6 mg/dL (ref >39.00)
LDL CALC: 153 mg/dL — AB (ref 0–99)
NonHDL: 168.97
TRIGLYCERIDES: 78 mg/dL (ref 0.0–149.0)
Total CHOL/HDL Ratio: 4
VLDL: 15.6 mg/dL (ref 0.0–40.0)

## 2017-11-01 ENCOUNTER — Other Ambulatory Visit: Payer: Self-pay | Admitting: Family Medicine

## 2017-11-01 DIAGNOSIS — I10 Essential (primary) hypertension: Secondary | ICD-10-CM

## 2018-01-13 ENCOUNTER — Ambulatory Visit: Payer: BLUE CROSS/BLUE SHIELD | Admitting: Internal Medicine

## 2018-01-13 ENCOUNTER — Encounter: Payer: Self-pay | Admitting: Internal Medicine

## 2018-01-13 VITALS — BP 132/66 | HR 75 | Temp 98.4°F | Resp 14 | Ht 70.0 in | Wt 184.4 lb

## 2018-01-13 DIAGNOSIS — J069 Acute upper respiratory infection, unspecified: Secondary | ICD-10-CM | POA: Diagnosis not present

## 2018-01-13 DIAGNOSIS — J029 Acute pharyngitis, unspecified: Secondary | ICD-10-CM | POA: Diagnosis not present

## 2018-01-13 LAB — POCT RAPID STREP A (OFFICE): Rapid Strep A Screen: NEGATIVE

## 2018-01-13 NOTE — Patient Instructions (Signed)
Rest, fluids , tylenol  For cough:  Take Mucinex DM twice a day as needed until better  For nasal congestion: Use OTC Nasocort or Flonase : 2 nasal sprays on each side of the nose in the morning until you feel better  Claritin OTC, an allergy medication might help you with the congestion as well.  Okay to take 1 tablet daily as needed  Call if not gradually better over the next  10 days  Call anytime if the symptoms are severe

## 2018-01-13 NOTE — Progress Notes (Signed)
Pre visit review using our clinic review tool, if applicable. No additional management support is needed unless otherwise documented below in the visit note. 

## 2018-01-13 NOTE — Progress Notes (Signed)
Subjective:    Patient ID: Brendan Sharp, male    DOB: Feb 16, 1961, 57 y.o.   MRN: [id number]  DOS:  01/13/2018 Type of visit - description : Acute visit Interval history: Symptoms started 4 days ago: Mild cough, chest congestion, runny nose.  Eyes are slightly watery Today he felt feverish but did not check his Temp   Review of Systems Denies sick contacts Very little sputum production.  Nose is congested but no major nasal discharge No headache, no nausea/vomiting/diarrhea  Past Medical History:  Diagnosis Date  . Abnormal serum level of alkaline phosphatase 10/23/11   37  . Arthritis   . Chondromalacia of right patella   . Esophageal reflux    no Endo to date  . Hyperlipemia   . IBS (irritable bowel syndrome)    Dr Sharlett Iles    Past Surgical History:  Procedure Laterality Date  . CHONDROPLASTY Right 01/03/2015   Procedure: CHONDROPLASTY;  Surgeon: Kathryne Hitch, MD;  Location: [facility];  Service: Orthopedics;  Laterality: Right;  . COLONOSCOPY  2013   for IBS, Dr Sharlett Iles  . hydrocele      surgery @ age 37  . KNEE ARTHROSCOPY WITH MEDIAL MENISECTOMY Right 01/03/2015   Procedure: RIGHT KNEE ARTHROSCOPY CHONDROPLASTY WITH MEDIAL MENISCECTOMY;  Surgeon: Kathryne Hitch, MD;  Location: [facility];  Service: Orthopedics;  Laterality: Right;  . TONSILLECTOMY AND ADENOIDECTOMY      Social History   Socioeconomic History  . Marital status: Married    Spouse name: Not on file  . Number of children: 1  . Years of education: MS  . Highest education level: Not on file  Occupational History  . Occupation: Higher education careers adviser: [employer]  Social Needs  . Financial resource strain: Not on file  . Food insecurity:    Worry: Not on file    Inability: Not on file  . Transportation needs:    Medical: Not on file    Non-medical: Not on file  Tobacco Use  . Smoking status: Never Smoker  . Smokeless tobacco: Never Used  Substance  and Sexual Activity  . Alcohol use: Yes    Comment: 2-3 /week  . Drug use: No  . Sexual activity: Not on file  Lifestyle  . Physical activity:    Days per week: Not on file    Minutes per session: Not on file  . Stress: Not on file  Relationships  . Social connections:    Talks on phone: Not on file    Gets together: Not on file    Attends religious service: Not on file    Active member of club or organization: Not on file    Attends meetings of clubs or organizations: Not on file    Relationship status: Not on file  . Intimate partner violence:    Fear of current or ex partner: Not on file    Emotionally abused: Not on file    Physically abused: Not on file    Forced sexual activity: Not on file  Other Topics Concern  . Not on file  Social History Narrative   Caffeine daily    He works as an Art gallery manager.   Lives with wife in a 2 story home.    Has one child.    Education: MSE      Allergies as of 01/13/2018      Reactions   Epinephrine    Tachycardia post  shot @ Dentist ( he is unsure whether it was epinephrine or Lidocaine)      Medication List        Accurate as of 01/13/18 11:59 PM. Always use your most recent med list.          aspirin EC 81 MG tablet Take 81 mg by mouth daily.   azithromycin 250 MG tablet Commonly known as:  ZITHROMAX Take 2 tablets by mouth on day 1, followed by 1 tablet by mouth daily for 4 days.   FISH OIL BURP-LESS 1000 MG Caps Take by mouth.   fluticasone 50 MCG/ACT nasal spray Commonly known as:  FLONASE Place 2 sprays into both nostrils daily.   HYDROcodone-homatropine 5-1.5 MG/5ML syrup Commonly known as:  HYCODAN Take 5 mLs by mouth every 8 (eight) hours as needed.   losartan 50 MG tablet Commonly known as:  COZAAR TAKE 1 TABLET BY MOUTH ONCE DAILY          Objective:   Physical Exam BP 132/66 (BP Location: Left Arm, Patient Position: Sitting, Cuff Size: Small)   Pulse 75   Temp 98.4 F (36.9 C)  (Oral)   Resp 14   Ht 5\' 10"  (1.778 m)   Wt 184 lb 6 oz (83.6 kg)   SpO2 97%   BMI 26.46 kg/m  General:   Well developed, well nourished . NAD.  HEENT:  Normocephalic . Face symmetric, atraumatic.  TMs normal.  Nose is slightly congested, sinuses no TTP.  Throat symmetric, slightly red Lungs:  CTA B Normal respiratory effort, no intercostal retractions, no accessory muscle use. Heart: RRR,  no murmur.  No pretibial edema bilaterally  Skin: Not pale. Not jaundice Neurologic:  alert & oriented X3.  Speech normal, gait appropriate for age and unassisted Psych--  Cognition and judgment appear intact.  Cooperative with normal attention span and concentration.  Behavior appropriate. No anxious or depressed appearing.      Assessment & Plan:   57 year old gentleman, PMH includes HTN,  hyperglycemia, mild dyslipidemia, mild OSA presents with :  URI: Strep test negative, rec supportive treatment, see instructions

## 2018-02-26 DIAGNOSIS — H00019 Hordeolum externum unspecified eye, unspecified eyelid: Secondary | ICD-10-CM | POA: Diagnosis not present

## 2018-03-17 DIAGNOSIS — H00015 Hordeolum externum left lower eyelid: Secondary | ICD-10-CM | POA: Diagnosis not present

## 2018-03-17 DIAGNOSIS — H00021 Hordeolum internum right upper eyelid: Secondary | ICD-10-CM | POA: Diagnosis not present

## 2018-05-14 ENCOUNTER — Encounter (HOSPITAL_BASED_OUTPATIENT_CLINIC_OR_DEPARTMENT_OTHER): Payer: Self-pay | Admitting: Emergency Medicine

## 2018-05-14 ENCOUNTER — Other Ambulatory Visit: Payer: Self-pay

## 2018-05-14 ENCOUNTER — Emergency Department (HOSPITAL_BASED_OUTPATIENT_CLINIC_OR_DEPARTMENT_OTHER)
Admission: EM | Admit: 2018-05-14 | Discharge: 2018-05-14 | Disposition: A | Discharge status: Home / Self Care | Payer: BLUE CROSS/BLUE SHIELD | Attending: Emergency Medicine | Admitting: Emergency Medicine

## 2018-05-14 DIAGNOSIS — Z7982 Long term (current) use of aspirin: Secondary | ICD-10-CM | POA: Diagnosis not present

## 2018-05-14 DIAGNOSIS — Y998 Other external cause status: Secondary | ICD-10-CM | POA: Insufficient documentation

## 2018-05-14 DIAGNOSIS — T7840XA Allergy, unspecified, initial encounter: Secondary | ICD-10-CM | POA: Diagnosis not present

## 2018-05-14 DIAGNOSIS — Y9389 Activity, other specified: Secondary | ICD-10-CM | POA: Insufficient documentation

## 2018-05-14 DIAGNOSIS — S70361A Insect bite (nonvenomous), right thigh, initial encounter: Secondary | ICD-10-CM | POA: Insufficient documentation

## 2018-05-14 DIAGNOSIS — W57XXXA Bitten or stung by nonvenomous insect and other nonvenomous arthropods, initial encounter: Secondary | ICD-10-CM | POA: Diagnosis not present

## 2018-05-14 DIAGNOSIS — Y929 Unspecified place or not applicable: Secondary | ICD-10-CM | POA: Diagnosis not present

## 2018-05-14 DIAGNOSIS — I10 Essential (primary) hypertension: Secondary | ICD-10-CM | POA: Insufficient documentation

## 2018-05-14 DIAGNOSIS — S80861A Insect bite (nonvenomous), right lower leg, initial encounter: Secondary | ICD-10-CM | POA: Diagnosis not present

## 2018-05-14 DIAGNOSIS — Z79899 Other long term (current) drug therapy: Secondary | ICD-10-CM | POA: Diagnosis not present

## 2018-05-14 DIAGNOSIS — S70362A Insect bite (nonvenomous), left thigh, initial encounter: Secondary | ICD-10-CM | POA: Diagnosis not present

## 2018-05-14 HISTORY — DX: Essential (primary) hypertension: I10

## 2018-05-14 MED ORDER — PREDNISONE 20 MG PO TABS
40.0000 mg | ORAL_TABLET | Freq: Once | ORAL | Status: AC
  Administered 2018-05-14: 40 mg via ORAL
  Filled 2018-05-14: qty 2

## 2018-05-14 MED ORDER — PREDNISONE 10 MG PO TABS: 40.0000 mg | ORAL_TABLET | Freq: Every day | ORAL | 0 refills | Status: DC

## 2018-05-14 NOTE — ED Triage Notes (Signed)
Insect bite to R thigh earlier today. Pt reports itching and redness to his leg and itching to his hands also.

## 2018-05-14 NOTE — ED Provider Notes (Signed)
[facility] EMERGENCY DEPARTMENT Provider Note   CSN: [id number] Arrival date & time: 05/14/18  1321     History   Chief Complaint Chief Complaint  Patient presents with  . Insect Bite    HPI Brendan Sharp is a 57 y.o. male.  The history is provided by the patient. No language interpreter was used.   Brendan Sharp is a 57 y.o. male who presents to the Emergency Department complaining of insect bite. He presents to the emergency department for evaluation following an insect bite. About one hour prior to ED arrival he felt a bite to his right leg with associated swelling with an area of itching just inferior to this. Shortly after that happened he developed a red rash throughout most of his body, predominantly in his groin and underarms as well as axilla. No prior similar symptoms he. He denies any fevers, chest pain, shortness of breath, throat itching or swelling, nausea, vomiting, lightheadedness.  Past Medical History:  Diagnosis Date  . Abnormal serum level of alkaline phosphatase 10/23/11   37  . Arthritis   . Chondromalacia of right patella   . Esophageal reflux    no Endo to date  . Hyperlipemia   . Hypertension   . IBS (irritable bowel syndrome)    Dr Sharlett Iles    Patient Active Problem List   Diagnosis Date Noted  . Benign essential hypertension 10/14/2016  . DOE (dyspnea on exertion) 02/21/2014  . Nonspecific abnormal electrocardiogram (ECG) (EKG) 12/01/2011  . History of gastroesophageal reflux (GERD) 08/17/2011  . Dyslipidemia 06/01/2011  . Irritable bowel syndrome (IBS) 06/01/2011    Past Surgical History:  Procedure Laterality Date  . CHONDROPLASTY Right 01/03/2015   Procedure: CHONDROPLASTY;  Surgeon: Kathryne Hitch, MD;  Location: [place];  Service: Orthopedics;  Laterality: Right;  . COLONOSCOPY  2013   for IBS, Dr Sharlett Iles  . hydrocele      surgery @ age 87  . KNEE ARTHROSCOPY WITH MEDIAL MENISECTOMY Right  01/03/2015   Procedure: RIGHT KNEE ARTHROSCOPY CHONDROPLASTY WITH MEDIAL MENISCECTOMY;  Surgeon: Kathryne Hitch, MD;  Location: [place];  Service: Orthopedics;  Laterality: Right;  . TONSILLECTOMY AND ADENOIDECTOMY          Home Medications    Prior to Admission medications   Medication Sig Start Date End Date Taking? Authorizing Provider  aspirin EC 81 MG tablet Take 81 mg by mouth daily.    [provider]  azithromycin (ZITHROMAX) 250 MG tablet Take 2 tablets by mouth on day 1, followed by 1 tablet by mouth daily for 4 days. Patient not taking: Reported on 01/13/2018 08/23/17   Saguier, Percell Miller, PA-C  fluticasone [facility]) 50 MCG/ACT nasal spray Place 2 sprays into both nostrils daily. Patient not taking: Reported on 01/13/2018 08/23/17   Saguier, Percell Miller, PA-C  HYDROcodone-homatropine [facility]) 5-1.5 MG/5ML syrup Take 5 mLs by mouth every 8 (eight) hours as needed. Patient not taking: Reported on 01/13/2018 08/23/17   Saguier, Percell Miller, PA-C  losartan (COZAAR) 50 MG tablet TAKE 1 TABLET BY MOUTH ONCE DAILY 11/03/17   Copland, Gay Filler, MD  Omega-3 Fatty Acids (FISH OIL BURP-LESS) 1000 MG CAPS Take by mouth.    [provider]  predniSONE (DELTASONE) 10 MG tablet Take 4 tablets (40 mg total) by mouth daily. 05/14/18   Quintella Reichert, MD    Family History Family History  Problem Relation Age of Onset  . Hyperlipidemia Mother   . Hyperlipidemia Father   .  Hypertension Father   . Heart attack Father   . Colon cancer Neg Hx   . Diabetes Neg Hx   . Stroke Neg Hx   . Heart disease Neg Hx   . Cancer Neg Hx     Social History Social History   Tobacco Use  . Smoking status: Never Smoker  . Smokeless tobacco: Never Used  Substance Use Topics  . Alcohol use: Yes    Comment: 2-3 /week  . Drug use: No     Allergies   Epinephrine   Review of Systems Review of Systems  All other systems reviewed and are negative.    Physical Exam Updated Vital  Signs BP (!) 147/101 (BP Location: Left Arm)   Pulse 99   Temp 98.7 F (37.1 C) (Oral)   Resp 18   Ht 5' 9.69" (1.77 m)   Wt 82.6 kg   SpO2 98%   BMI 26.35 kg/m   Physical Exam  Constitutional: He is oriented to person, place, and time. He appears well-developed and well-nourished.  HENT:  Head: Normocephalic and atraumatic.  Cardiovascular: Normal rate and regular rhythm.  No murmur heard. Pulmonary/Chest: Effort normal and breath sounds normal. No respiratory distress.  Musculoskeletal: He exhibits no edema or tenderness.  There is an erythematous area of swelling with central dictate red area consistent with insect bite to the right distal lateral thigh. Scattered areas of erythema and urticaria to bilateral axilla as well as AC fossa.  Neurological: He is alert and oriented to person, place, and time.  Skin: Skin is warm and dry.  Psychiatric: He has a normal mood and affect. His behavior is normal.  Nursing note and vitals reviewed.    ED Treatments / Results  Labs (all labs ordered are listed, but only abnormal results are displayed) Labs Reviewed - No data to display  EKG None  Radiology No results found.  Procedures Procedures (including critical care time)  Medications Ordered in ED Medications  predniSONE (DELTASONE) tablet 40 mg (has no administration in time range)     Initial Impression / Assessment and Plan / ED Course  I have reviewed the triage vital signs and the nursing notes.  Pertinent labs & imaging results that were available during my care of the patient were reviewed by me and considered in my medical decision making (see chart for details).     Patient here for evaluation of insect bite and rash. He is non-toxic appearing on examination and in no acute distress. No historical or exam features consistent with anaphylaxis. Discussed with patient home care for allergic reaction following an insect bite. Discussed outpatient follow-up and  return precautions.  Final Clinical Impressions(s) / ED Diagnoses   Final diagnoses:  Insect bite of right thigh, initial encounter  Allergic reaction, initial encounter    ED Discharge Orders         Ordered    predniSONE (DELTASONE) 10 MG tablet  Daily     05/14/18 1427           Quintella Reichert, MD 05/14/18 1504

## 2018-05-22 NOTE — Progress Notes (Addendum)
[facility] [address], [place], [place] [address] [phone]  Date:  05/25/2018   Name:  Brendan Sharp   DOB:  [birth year]   MRN:  [id number]  PCP:  Darreld Mclean, MD    Chief Complaint: Annual Exam and Flu Vaccine (declines flu shot today)   History of Present Illness:  Brendan Sharp is a 57 y.o. very pleasant male patient who presents with the following:  Annual PE today History of HTN, dyslipidemia, IBS  Labs: due, but will do in the am as he is not fasting Immun: flu- will do with his wife, discussed shingrix and he will consider it  Colon: 2/13, 10 year follow-up recommended   Last seen here by myself about a year ago for his CPE He has continued to have intermittent numbness in his bilateral legs. He has had this for a couple of years now  - he went to neurology and had evaluation, no cause was found  Per last neurology note:  This is a 57 year old gentleman referred for evaluation of bilateral total leg dysesthesias. He describes sensation of quivering of the muscles, without actually seeing muscle twitches or any abnormal movement. There is no associated weakness, cramping, or numbness or tingling. His neurological exam is entirely normal and nonfocal. I reassured him that I do not see any evidence of anything worrisome causing his symptoms. His his vitamin B 12 level is on the low side of normal, I would recommend that he start vitamin B12 1000 g daily and see if this helps. To further evaluate his symptoms, NCS/EMG of the lower extremities will be ordered, my overall suspicion for finding anything worrisome is very low.  Further, his symptoms are not consistent with restless leg syndrome.   His sx will come and go It does not bother him at night  When he is walking it does not bother him as much Sx equal in both feet  Worse standing still or sitting He may have an occasional jerk of his feet   He had nerve conduction studies and these were ok No ABI as of yet    Patient Active Problem List   Diagnosis Date Noted  . Benign essential hypertension 10/14/2016  . DOE (dyspnea on exertion) 02/21/2014  . Nonspecific abnormal electrocardiogram (ECG) (EKG) 12/01/2011  . History of gastroesophageal reflux (GERD) 08/17/2011  . Dyslipidemia 06/01/2011  . Irritable bowel syndrome (IBS) 06/01/2011    Past Medical History:  Diagnosis Date  . Abnormal serum level of alkaline phosphatase 10/23/11   37  . Arthritis   . Chondromalacia of right patella   . Esophageal reflux    no Endo to date  . Hyperlipemia   . Hypertension   . IBS (irritable bowel syndrome)    Dr Sharlett Iles    Past Surgical History:  Procedure Laterality Date  . CHONDROPLASTY Right 01/03/2015   Procedure: CHONDROPLASTY;  Surgeon: Kathryne Hitch, MD;  Location: [place];  Service: Orthopedics;  Laterality: Right;  . COLONOSCOPY  2013   for IBS, Dr Sharlett Iles  . hydrocele      surgery @ age 21  . KNEE ARTHROSCOPY WITH MEDIAL MENISECTOMY Right 01/03/2015   Procedure: RIGHT KNEE ARTHROSCOPY CHONDROPLASTY WITH MEDIAL MENISCECTOMY;  Surgeon: Kathryne Hitch, MD;  Location: [place];  Service: Orthopedics;  Laterality: Right;  . TONSILLECTOMY AND ADENOIDECTOMY      Social History   Tobacco Use  .  Smoking status: Never Smoker  . Smokeless tobacco: Never Used  Substance Use Topics  . Alcohol use: Yes    Comment: 2-3 /week  . Drug use: No    Family History  Problem Relation Age of Onset  . Hyperlipidemia Mother   . Hyperlipidemia Father   . Hypertension Father   . Heart attack Father   . Colon cancer Neg Hx   . Diabetes Neg Hx   . Stroke Neg Hx   . Heart disease Neg Hx   . Cancer Neg Hx     No Active Allergies  Medication list has been reviewed and updated.  Current Outpatient Medications on File Prior to Visit  Medication Sig Dispense Refill  . aspirin EC 81 MG tablet  Take 81 mg by mouth daily.    Marland Kitchen losartan (COZAAR) 50 MG tablet TAKE 1 TABLET BY MOUTH ONCE DAILY 30 tablet 11  . Omega-3 Fatty Acids (FISH OIL BURP-LESS) 1000 MG CAPS Take by mouth.     No current facility-administered medications on file prior to visit.     Review of Systems:  As per HPI- otherwise negative. Otherwise he is feeling well Continues to exercise on a regular basis No CP or SOB    Physical Examination: Vitals:   05/25/18 1525  BP: 134/86  Pulse: 78  Resp: 16  Temp: 98.5 F (36.9 C)  SpO2: 99%   Vitals:   05/25/18 1525  Weight: 183 lb (83 kg)  Height: 5' 9.5" (1.765 m)   Body mass index is 26.64 kg/m. Ideal Body Weight: Weight in (lb) to have BMI = 25: 171.4  GEN: WDWN, NAD, Non-toxic, A & O x 3, looks well  HEENT: Atraumatic, Normocephalic. Neck supple. No masses, No LAD.  Bilateral TM wnl, oropharynx normal.  PEERL,EOMI.   Ears and Nose: No external deformity. CV: RRR, No M/G/R. No JVD. No thrill. No extra heart sounds. PULM: CTA B, no wheezes, crackles, rhonchi. No retractions. No resp. distress. No accessory muscle use. ABD: S, NT, ND. No rebound. No HSM. EXTR: No c/c/e NEURO Normal gait.  PSYCH: Normally interactive. Conversant. Not depressed or anxious appearing.  Calm demeanor.  Normal foot exam, normal sensation to monofilament and pulses in both feet    Assessment and Plan: Physical exam  Screening for diabetes mellitus - Plan: Comprehensive metabolic panel, Hemoglobin A1c  Screening for deficiency anemia - Plan: CBC  Dyslipidemia - Plan: Lipid panel  Screening for prostate cancer - Plan: PSA  Benign essential hypertension - Plan: losartan (COZAAR) 50 MG tablet  B12 deficiency - Plan: B12  Numbness and tingling of both legs below knees - Plan: VAS [place] ABI WITH/WO TBI  He will come in for fasting labs tomorrow Ordered ABI Will plan further follow- up pending labs. He will do flu shot with wife later  BP well controlled    Signed Lamar Blinks, MD  Received his labs 10/12- message to pt  Results for orders placed or performed in visit on 05/26/18 -Lipid panel      Result                      Value             Ref Range          Cholesterol                 251 (H)           0 - 200  mg/dL      Triglycerides               142.0             0.0 - 149.0 *      HDL                         60.40             >39.00 mg/dL       VLDL                        28.4              0.0 - 40.0 m*      LDL Cholesterol             163 (H)           0 - 99 mg/dL       Total CHOL/HDL Ratio        4                                    NonHDL                      190.90                          -B12      Result                      Value             Ref Range          Vitamin B-12                325               211 - 911 pg* -PSA      Result                      Value             Ref Range          PSA                         0.58              0.10 - 4.00 * -CBC      Result                      Value             Ref Range          WBC                         4.6               4.0 - 10.5 K*      RBC                         4.39              4.22 - 5.81 *      Platelets  244.0             150.0 - 400.*      Hemoglobin                  14.7              13.0 - 17.0 *      HCT                         43.1              39.0 - 52.0 %      MCV                         98.2              78.0 - 100.0*      MCHC                        34.1              30.0 - 36.0 *      RDW                         12.6              11.5 - 15.5 % -Comprehensive metabolic panel      Result                      Value             Ref Range          Sodium                      140               135 - 145 mE*      Potassium                   4.1               3.5 - 5.1 mE*      Chloride                    105               96 - 112 mEq*      CO2                         26                19 - 32 mEq/L      Glucose, Bld                 88                70 - 99 mg/dL      BUN                         21                6 - 23 mg/dL       Creatinine, Ser             1.06  0.40 - 1.50 *      Total Bilirubin             0.5               0.2 - 1.2 mg*      Alkaline Phosphatase        30 (L)            39 - 117 U/L       AST                         32                0 - 37 U/L         ALT                         45                0 - 53 U/L         Total Protein               6.7               6.0 - 8.3 g/*      Albumin                     4.4               3.5 - 5.2 g/*      Calcium                     9.5               8.4 - 10.5 m*      GFR                         76.88             >60.00 mL/min -Hemoglobin A1c      Result                      Value             Ref Range          Hgb A1c MFr Bld             5.6               4.6 - 6.5 %      Blood counts and metabolic profile are ok        Your alkaline phosphatase is slightly low still-  This is not new, and I tend to think is benign variation A1c does not show any sign of diabetes Your cholesterol is still high- with your numbers I calculated your estimated 10 year risk of cardiovascular disease at 17%, which is too high for someone your age. I would recommend that we try putting you back on a cholesterol medication if you are up for it Please let me know if this is something you would be ok with doing and I can send in rx for you In any case let's please visit in 6 months

## 2018-05-25 ENCOUNTER — Encounter: Payer: Self-pay | Admitting: Family Medicine

## 2018-05-25 ENCOUNTER — Ambulatory Visit (INDEPENDENT_AMBULATORY_CARE_PROVIDER_SITE_OTHER): Payer: BLUE CROSS/BLUE SHIELD | Admitting: Family Medicine

## 2018-05-25 VITALS — BP 134/86 | HR 78 | Temp 98.5°F | Resp 16 | Ht 69.5 in | Wt 183.0 lb

## 2018-05-25 DIAGNOSIS — Z130 Encounter for screening for diseases of the blood and blood-forming organs and certain disorders involving the immune mechanism: Secondary | ICD-10-CM

## 2018-05-25 DIAGNOSIS — I10 Essential (primary) hypertension: Secondary | ICD-10-CM

## 2018-05-25 DIAGNOSIS — Z0000 Encounter for general adult medical examination without abnormal findings: Secondary | ICD-10-CM

## 2018-05-25 DIAGNOSIS — R200 Anesthesia of skin: Secondary | ICD-10-CM

## 2018-05-25 DIAGNOSIS — E538 Deficiency of other specified B group vitamins: Secondary | ICD-10-CM

## 2018-05-25 DIAGNOSIS — R202 Paresthesia of skin: Secondary | ICD-10-CM

## 2018-05-25 DIAGNOSIS — Z131 Encounter for screening for diabetes mellitus: Secondary | ICD-10-CM | POA: Diagnosis not present

## 2018-05-25 DIAGNOSIS — Z125 Encounter for screening for malignant neoplasm of prostate: Secondary | ICD-10-CM

## 2018-05-25 DIAGNOSIS — E785 Hyperlipidemia, unspecified: Secondary | ICD-10-CM

## 2018-05-25 MED ORDER — LOSARTAN POTASSIUM 50 MG PO TABS: 50.0000 mg | ORAL_TABLET | Freq: Every day | ORAL | 3 refills | Status: DC

## 2018-05-25 NOTE — Patient Instructions (Addendum)
Good to see you today!  I will be in touch with your labs asap  We will set up a test for the circulation in your legs due to your persistent cramping symptoms Otherwise continue taking good care of yourself!   Be sure to get your flu shot this fall Also I would recommend that you consider getting the shingrix vaccine to prevent shingles    Health Maintenance, Male A healthy lifestyle and preventive care is important for your health and wellness. Ask your health care provider about what schedule of regular examinations is right for you. What should I know about weight and diet? Eat a Healthy Diet  Eat plenty of vegetables, fruits, whole grains, low-fat dairy products, and lean protein.  Do not eat a lot of foods high in solid fats, added sugars, or salt.  Maintain a Healthy Weight Regular exercise can help you achieve or maintain a healthy weight. You should:  Do at least 150 minutes of exercise each week. The exercise should increase your heart rate and make you sweat (moderate-intensity exercise).  Do strength-training exercises at least twice a week.  Watch Your Levels of Cholesterol and Blood Lipids  Have your blood tested for lipids and cholesterol every 5 years starting at 55 years of age. If you are at high risk for heart disease, you should start having your blood tested when you are 55 years old. You may need to have your cholesterol levels checked more often if: ? Your lipid or cholesterol levels are high. ? You are older than 55 years of age. ? You are at high risk for heart disease.  What should I know about cancer screening? Many types of cancers can be detected early and may often be prevented. Lung Cancer  You should be screened every year for lung cancer if: ? You are a current smoker who has smoked for at least 30 years. ? You are a former smoker who has quit within the past 15 years.  Talk to your health care provider about your screening options, when you  should start screening, and how often you should be screened.  Colorectal Cancer  Routine colorectal cancer screening usually begins at 55 years of age and should be repeated every 5-10 years until you are 56 years old. You may need to be screened more often if early forms of precancerous polyps or small growths are found. Your health care provider may recommend screening at an earlier age if you have risk factors for colon cancer.  Your health care provider may recommend using home test kits to check for hidden blood in the stool.  A small camera at the end of a tube can be used to examine your colon (sigmoidoscopy or colonoscopy). This checks for the earliest forms of colorectal cancer.  Prostate and Testicular Cancer  Depending on your age and overall health, your health care provider may do certain tests to screen for prostate and testicular cancer.  Talk to your health care provider about any symptoms or concerns you have about testicular or prostate cancer.  Skin Cancer  Check your skin from head to toe regularly.  Tell your health care provider about any new moles or changes in moles, especially if: ? There is a change in a mole's size, shape, or color. ? You have a mole that is larger than a pencil eraser.  Always use sunscreen. Apply sunscreen liberally and repeat throughout the day.  Protect yourself by wearing long sleeves, pants, a wide-brimmed  hat, and sunglasses when outside.  What should I know about heart disease, diabetes, and high blood pressure?  If you are 31-45 years of age, have your blood pressure checked every 3-5 years. If you are 87 years of age or older, have your blood pressure checked every year. You should have your blood pressure measured twice-once when you are at a hospital or clinic, and once when you are not at a hospital or clinic. Record the average of the two measurements. To check your blood pressure when you are not at a hospital or clinic, you  can use: ? An automated blood pressure machine at a pharmacy. ? A home blood pressure monitor.  Talk to your health care provider about your target blood pressure.  If you are between 70-41 years old, ask your health care provider if you should take aspirin to prevent heart disease.  Have regular diabetes screenings by checking your fasting blood sugar level. ? If you are at a normal weight and have a low risk for diabetes, have this test once every three years after the age of 33. ? If you are overweight and have a high risk for diabetes, consider being tested at a younger age or more often.  A one-time screening for abdominal aortic aneurysm (AAA) by ultrasound is recommended for men aged 71-75 years who are current or former smokers. What should I know about preventing infection? Hepatitis B If you have a higher risk for hepatitis B, you should be screened for this virus. Talk with your health care provider to find out if you are at risk for hepatitis B infection. Hepatitis C Blood testing is recommended for:  Everyone born from [birth year] through 1963.  Anyone with known risk factors for hepatitis C.  Sexually Transmitted Diseases (STDs)  You should be screened each year for STDs including gonorrhea and chlamydia if: ? You are sexually active and are younger than 55 years of age. ? You are older than 55 years of age and your health care provider tells you that you are at risk for this type of infection. ? Your sexual activity has changed since you were last screened and you are at an increased risk for chlamydia or gonorrhea. Ask your health care provider if you are at risk.  Talk with your health care provider about whether you are at high risk of being infected with HIV. Your health care provider may recommend a prescription medicine to help prevent HIV infection.  What else can I do?  Schedule regular health, dental, and eye exams.  Stay current with your vaccines  (immunizations).  Do not use any tobacco products, such as cigarettes, chewing tobacco, and e-cigarettes. If you need help quitting, ask your health care provider.  Limit alcohol intake to no more than 2 drinks per day. One drink equals 12 ounces of beer, 5 ounces of wine, or 1 ounces of hard liquor.  Do not use street drugs.  Do not share needles.  Ask your health care provider for help if you need support or information about quitting drugs.  Tell your health care provider if you often feel depressed.  Tell your health care provider if you have ever been abused or do not feel safe at home. This information is not intended to replace advice given to you by your health care provider. Make sure you discuss any questions you have with your health care provider. Document Released: 01/30/2008 Document Revised: 04/01/2016 Document Reviewed: 05/07/2015 Elsevier Interactive Patient Education  2018 [employer]  

## 2018-05-26 ENCOUNTER — Other Ambulatory Visit (INDEPENDENT_AMBULATORY_CARE_PROVIDER_SITE_OTHER): Payer: BLUE CROSS/BLUE SHIELD

## 2018-05-26 DIAGNOSIS — E785 Hyperlipidemia, unspecified: Secondary | ICD-10-CM

## 2018-05-26 DIAGNOSIS — Z125 Encounter for screening for malignant neoplasm of prostate: Secondary | ICD-10-CM | POA: Diagnosis not present

## 2018-05-26 DIAGNOSIS — Z131 Encounter for screening for diabetes mellitus: Secondary | ICD-10-CM | POA: Diagnosis not present

## 2018-05-26 DIAGNOSIS — E538 Deficiency of other specified B group vitamins: Secondary | ICD-10-CM

## 2018-05-26 DIAGNOSIS — Z130 Encounter for screening for diseases of the blood and blood-forming organs and certain disorders involving the immune mechanism: Secondary | ICD-10-CM

## 2018-05-26 LAB — COMPREHENSIVE METABOLIC PANEL
ALT: 45 U/L (ref 0–53)
AST: 32 U/L (ref 0–37)
Albumin: 4.4 g/dL (ref 3.5–5.2)
Alkaline Phosphatase: 30 U/L — ABNORMAL LOW (ref 39–117)
BILIRUBIN TOTAL: 0.5 mg/dL (ref 0.2–1.2)
BUN: 21 mg/dL (ref 6–23)
CALCIUM: 9.5 mg/dL (ref 8.4–10.5)
CHLORIDE: 105 meq/L (ref 96–112)
CO2: 26 meq/L (ref 19–32)
Creatinine, Ser: 1.06 mg/dL (ref 0.40–1.50)
GFR: 76.88 mL/min (ref >60.00)
Glucose, Bld: 88 mg/dL (ref 70–99)
POTASSIUM: 4.1 meq/L (ref 3.5–5.1)
Sodium: 140 mEq/L (ref 135–145)
Total Protein: 6.7 g/dL (ref 6.0–8.3)

## 2018-05-26 LAB — CBC
HCT: 43.1 % (ref 39.0–52.0)
Hemoglobin: 14.7 g/dL (ref 13.0–17.0)
MCHC: 34.1 g/dL (ref 30.0–36.0)
MCV: 98.2 fl (ref 78.0–100.0)
PLATELETS: 244 10*3/uL (ref 150.0–400.0)
RBC: 4.39 Mil/uL (ref 4.22–5.81)
RDW: 12.6 % (ref 11.5–15.5)
WBC: 4.6 10*3/uL (ref 4.0–10.5)

## 2018-05-26 LAB — LIPID PANEL
CHOLESTEROL: 251 mg/dL — AB (ref 0–200)
HDL: 60.4 mg/dL (ref >39.00)
LDL CALC: 163 mg/dL — AB (ref 0–99)
NonHDL: 190.9
TRIGLYCERIDES: 142 mg/dL (ref 0.0–149.0)
Total CHOL/HDL Ratio: 4
VLDL: 28.4 mg/dL (ref 0.0–40.0)

## 2018-05-26 LAB — HEMOGLOBIN A1C: Hgb A1c MFr Bld: 5.6 % (ref 4.6–6.5)

## 2018-05-26 LAB — VITAMIN B12: VITAMIN B 12: 325 pg/mL (ref 211–911)

## 2018-05-26 LAB — PSA: PSA: 0.58 ng/mL (ref 0.10–4.00)

## 2018-05-28 ENCOUNTER — Encounter: Payer: Self-pay | Admitting: Family Medicine

## 2018-06-01 ENCOUNTER — Ambulatory Visit (HOSPITAL_COMMUNITY)
Admission: RE | Admit: 2018-06-01 | Discharge: 2018-06-01 | Disposition: A | Discharge status: Home / Self Care | Payer: BLUE CROSS/BLUE SHIELD | Source: Ambulatory Visit | Attending: Family Medicine | Admitting: Family Medicine

## 2018-06-01 DIAGNOSIS — R202 Paresthesia of skin: Secondary | ICD-10-CM | POA: Diagnosis not present

## 2018-06-01 DIAGNOSIS — R200 Anesthesia of skin: Secondary | ICD-10-CM | POA: Diagnosis not present

## 2018-06-02 ENCOUNTER — Encounter (HOSPITAL_COMMUNITY): Payer: BLUE CROSS/BLUE SHIELD

## 2018-06-03 ENCOUNTER — Encounter: Payer: Self-pay | Admitting: Family Medicine

## 2018-07-01 ENCOUNTER — Ambulatory Visit: Payer: BLUE CROSS/BLUE SHIELD | Admitting: Neurology

## 2018-07-06 ENCOUNTER — Encounter: Payer: Self-pay | Admitting: Family Medicine

## 2018-07-06 NOTE — Telephone Encounter (Signed)
BP Readings from Last 3 Encounters:  05/25/18 134/86  05/14/18 (!) 147/101  01/13/18 132/66

## 2018-07-19 ENCOUNTER — Ambulatory Visit: Payer: BLUE CROSS/BLUE SHIELD | Admitting: Neurology

## 2018-07-19 ENCOUNTER — Encounter: Payer: Self-pay | Admitting: Neurology

## 2018-07-19 VITALS — BP 130/84 | HR 73 | Ht 69.5 in | Wt 187.2 lb

## 2018-07-19 DIAGNOSIS — R253 Fasciculation: Secondary | ICD-10-CM

## 2018-07-19 DIAGNOSIS — R251 Tremor, unspecified: Secondary | ICD-10-CM | POA: Diagnosis not present

## 2018-07-19 DIAGNOSIS — F518 Other sleep disorders not due to a substance or known physiological condition: Secondary | ICD-10-CM

## 2018-07-19 NOTE — Patient Instructions (Addendum)
We will order an MRI of the brain to be scheduled in January  Start drinking electrolyte water such as Powerade or Gatorade when working out  Increase yoga and mindfulness  Enjoy your vacation!

## 2018-07-19 NOTE — Progress Notes (Signed)
Follow-up Visit   Date: 07/19/18    RIDHAAN DREIBELBIS MRN: [id number] DOB: [birth year]   Interim History: Randle C Loftin is a 57 y.o. right-handed [place] male with hypertension returning to the clinic for follow-up of abnormal movements and muscle twitches.  The patient was accompanied to the clinic by self.  History of present illness: Starting around December 2017, he began having bilateral leg spasm, described as a sensation of "short electrical wires in the leg". Upon further questioning he states that it feels like there is trembling/quivering of the muscles, but denies seeing abnormal muscle movement.  It is mostly over the calf but also involves his thighs and occasionally his feet.  There no associated back pain, cramping, weakness, numbness/tingling.  It plays soccer and has not noticed that physical exertion makes it worse. The sensation is more annoying and does not prevent him from normal activity.  Discomfort is not worse at night time or with resting.    UPDATE 07/19/2018:  He was last seen in the clinic in April 2018 for muscle twiches.  He had NCS/EMG of the legs which was normal and lab testing for vitamin B12, folate, and CK was normal.  He is here with ongoing complaints of sensation of muscles twitching in the calf muscles.  Symptoms occur daily and last a few seconds.  He does not have numbness, tingling, or leg pain.  No back pain.    He stays very active and feels that his balance is slightly worse, for instance, when he is running, he needs to focus on running straight because he can veer to a side.  He also has noticed that his muscles tremble when he lifts weights and sometimes his body will jerk, especially when waking up in the morning.     He endorses being under a lot of stress over the past year and asks if this could be contributing to his symptoms.   He works as a Engineer, water for [employer].    Medications:  Current Outpatient  Medications on File Prior to Visit  Medication Sig Dispense Refill  . aspirin EC 81 MG tablet Take 81 mg by mouth daily.    Marland Kitchen losartan (COZAAR) 50 MG tablet Take 1 tablet (50 mg total) by mouth daily. 90 tablet 3  . Omega-3 Fatty Acids (FISH OIL BURP-LESS) 1000 MG CAPS Take by mouth.     No current facility-administered medications on file prior to visit.     Allergies: No Active Allergies  Review of Systems:  CONSTITUTIONAL: No fevers, chills, night sweats, or weight loss.  EYES: No visual changes or eye pain ENT: No hearing changes.  No history of nose bleeds.   RESPIRATORY: No cough, wheezing and shortness of breath.   CARDIOVASCULAR: Negative for chest pain, and palpitations.   GI: Negative for abdominal discomfort, blood in stools or black stools.  No recent change in bowel habits.   GU:  No history of incontinence.   MUSCLOSKELETAL: No history of joint pain or swelling.  No myalgias.   SKIN: Negative for lesions, rash, and itching.   ENDOCRINE: Negative for cold or heat intolerance, polydipsia or goiter.   PSYCH:  No depression or anxiety symptoms.   NEURO: As Above.   Vital Signs:  BP 130/84   Pulse 73   Ht 5' 9.5" (1.765 m)   Wt 187 lb 4 oz (84.9 kg)   SpO2 99%   BMI 27.26 kg/m   General Medical  Exam:   General:  Well appearing, comfortable  Eyes/ENT: see cranial nerve examination.   Neck: No masses appreciated.  Full range of motion without tenderness.  No carotid bruits. Respiratory:  Clear to auscultation, good air entry bilaterally.   Cardiac:  Regular rate and rhythm, no murmur.   Ext:  No edema  Neurological Exam: MENTAL STATUS including orientation to time, place, person, recent and remote memory, attention span and concentration, language, and fund of knowledge is normal.  Speech is not dysarthric.  CRANIAL NERVES:  Pupils equal round and reactive to light.  Normal conjugate, extra-ocular eye movements in all directions of gaze.  No ptosis.  Face is  symmetric. Palate elevates symmetrically.  Tongue is midline.  MOTOR:  Motor strength is 5/5 in all extremities.  No atrophy, fasciculations or abnormal movements.  No pronator drift.  Tone is normal.    MSRs:  Reflexes are 2+/4 throughout.  SENSORY:  Intact to vibration and temperature throughout.  COORDINATION/GAIT:  Normal finger-to- nose-finger.  Intact rapid alternating movements bilaterally.  Gait narrow based and stable. Stressed and tandem gait is normal.  Data: NCS/EMG of the legs 12/15/2016: This is a normal study.  In particular, there is no evidence of a generalized sensorimotor polyneuropathy, lumbosacral radiculopathy, or diffuse myopathy affecting the lower extremities.  Lab Results  Component Value Date   TSH 3.91 01/17/2015   Lab Results  Component Value Date   VITAMINB12 325 05/26/2018   Lab Results  Component Value Date   HGBA1C 5.6 05/26/2018   Lab Results  Component Value Date   CKTOTAL 148 10/14/2016   Lab Results  Component Value Date   FOLATE 12.2 10/14/2016     IMPRESSION/PLAN: Mr. Zurawski is a 57 year-old man returning for evaluation of muscle twitches, jerks, and tremor.  His neurological exam is entirely normal and non-focal.  NCS/EMG of the legs is also normal.  I do not suspect anything worrisome causing his symptoms.  He most likely has benign fasciculations from what he is telling me, although I am not able to observe this on his exam.  Hypnic jerks would explain is morning arousals. With respect to his muscle trembling when working out, this can be normal physiological movements when muscles are stretched against gravity with weights.  There is no weakness or atrophy on his exam that suggests anything else. Because of his widespread complaints which do not localize well to one particular etiology, I will order MRI brain to exclude demyelinating disease, although my overall suspicion is very low.    He does endorse being under a great deal of  stress over the past few years, related to his work.  He is a Government social research officer for [employer].  I have asked him to start doing more yoga exercises and mindfulness.  When working out, he was recommended to drink electrolyte water which may help his muscle twitches.  Further recommendations pending results.  The duration of this appointment visit was 30 minutes of face-to-face time with the patient.  Greater than 50% of this time was spent in counseling, explanation of diagnosis, planning of further management, and coordination of care.   Thank you for allowing me to participate in patient's care.  If I can answer any additional questions, I would be pleased to do so.    Sincerely,    Malone Vanblarcom K. Posey Pronto, DO

## 2018-07-20 ENCOUNTER — Other Ambulatory Visit: Payer: Self-pay | Admitting: *Deleted

## 2018-07-20 DIAGNOSIS — R251 Tremor, unspecified: Secondary | ICD-10-CM

## 2018-07-20 DIAGNOSIS — F518 Other sleep disorders not due to a substance or known physiological condition: Secondary | ICD-10-CM

## 2018-07-20 DIAGNOSIS — R253 Fasciculation: Secondary | ICD-10-CM

## 2018-07-26 ENCOUNTER — Telehealth: Payer: Self-pay | Admitting: Neurology

## 2018-07-26 NOTE — Telephone Encounter (Signed)
Sent a message to Es to find out status on PA.

## 2018-07-26 NOTE — Telephone Encounter (Signed)
Pt wants to make sure we are working on the [place] for the MRI with [employer] we need to call [phone] to get the auth. Please call patient and let him know the status

## 2018-07-28 NOTE — Telephone Encounter (Signed)
Patient notified that PA is being worked on.

## 2018-08-04 ENCOUNTER — Ambulatory Visit
Admission: RE | Admit: 2018-08-04 | Discharge: 2018-08-04 | Disposition: A | Discharge status: Home / Self Care | Payer: BLUE CROSS/BLUE SHIELD | Source: Ambulatory Visit | Attending: Neurology | Admitting: Neurology

## 2018-08-04 DIAGNOSIS — R251 Tremor, unspecified: Secondary | ICD-10-CM

## 2018-08-04 DIAGNOSIS — R253 Fasciculation: Secondary | ICD-10-CM

## 2018-08-04 DIAGNOSIS — I6782 Cerebral ischemia: Secondary | ICD-10-CM | POA: Diagnosis not present

## 2018-08-04 DIAGNOSIS — F518 Other sleep disorders not due to a substance or known physiological condition: Secondary | ICD-10-CM

## 2018-08-05 ENCOUNTER — Telehealth: Payer: Self-pay | Admitting: *Deleted

## 2018-08-05 ENCOUNTER — Encounter: Payer: Self-pay | Admitting: *Deleted

## 2018-08-05 ENCOUNTER — Encounter: Payer: Self-pay | Admitting: Family Medicine

## 2018-08-05 NOTE — Telephone Encounter (Signed)
-----   Message from Alda Berthold, DO sent at 08/05/2018  8:08 AM EST ----- Please inform patient that his MRI brian looks great and is normal. Thanks.

## 2018-08-05 NOTE — Telephone Encounter (Signed)
Results sent via My Chart.  

## 2018-10-29 ENCOUNTER — Encounter: Payer: Self-pay | Admitting: Family Medicine

## 2018-10-29 DIAGNOSIS — I10 Essential (primary) hypertension: Secondary | ICD-10-CM

## 2018-10-30 MED ORDER — AMLODIPINE-OLMESARTAN 5-20 MG PO TABS: 1.0000 | ORAL_TABLET | Freq: Every day | ORAL | 3 refills | Status: DC

## 2018-10-30 NOTE — Addendum Note (Signed)
Addended by: Lamar Blinks C on: 10/30/2018 07:43 PM   Modules accepted: Orders

## 2018-12-19 ENCOUNTER — Encounter: Payer: Self-pay | Admitting: Family Medicine

## 2018-12-19 DIAGNOSIS — I10 Essential (primary) hypertension: Secondary | ICD-10-CM

## 2018-12-22 MED ORDER — AMLODIPINE BESYLATE 2.5 MG PO TABS: 2.5000 mg | ORAL_TABLET | Freq: Every day | ORAL | 3 refills | Status: DC

## 2018-12-22 MED ORDER — OLMESARTAN MEDOXOMIL 5 MG PO TABS: 10.0000 mg | ORAL_TABLET | Freq: Every day | ORAL | 3 refills | Status: DC

## 2018-12-22 NOTE — Addendum Note (Signed)
Addended by: Lamar Blinks C on: 12/22/2018 12:56 PM   Modules accepted: Orders

## 2019-06-02 ENCOUNTER — Other Ambulatory Visit: Payer: Self-pay

## 2019-06-03 NOTE — Progress Notes (Addendum)
[place] at [facility] [address], [place], [place] [address] [phone]  Date:  06/05/2019   Name:  Brendan Sharp   DOB:  15-Mar-1961   MRN:  [id number]  PCP:  Darreld Mclean, MD    Chief Complaint: Annual Exam and Flu Vaccine   History of Present Illness:  Brendan Sharp is a 58 y.o. very pleasant male patient who presents with the following:  Here today for complete physical History of hypertension, dyslipidemia, IBS Last seen by myself for a physical about 1 year ago We have referred him to neurology in the past for an issue of bilateral total leg dysesthesias.  They did recommend boosting his vitamin B12 He has had nerve conduction studies, and I did ABI testing last year.  All normal. He notes that his legs are about the same  He wonders if his sx could be due to varicose veins - he has one superficial vein in his left leg.  I doubt this is the issue, but certainly we can refer him to vascular for an eval  He has good days and bad days as far as his leg tingling and numbness Not tingling, does not seem like claudication   Flu shot- give today Colonoscopy up-to-date Can suggest Shingrix- he prefers to delay due to pandemic  Most recent labs about 1 year ago, update today- he is not fasting   His family his doing well He is working form home until the end of this year at least- he is not sure when they will go back in the office  He is trying to exercise some- he rides an outdoor bike and walks some   His home BP can be higher in the am- 135/ In the afternoon better- generally 110-117/ 75- 36  His son is doing well, he practices family law   Patient Active Problem List   Diagnosis Date Noted  . Benign essential hypertension 10/14/2016  . DOE (dyspnea on exertion) 02/21/2014  . Nonspecific abnormal electrocardiogram (ECG) (EKG) 12/01/2011  . History of gastroesophageal reflux (GERD) 08/17/2011  .  Dyslipidemia 06/01/2011  . Irritable bowel syndrome (IBS) 06/01/2011    Past Medical History:  Diagnosis Date  . Abnormal serum level of alkaline phosphatase 10/23/11   37  . Arthritis   . Chondromalacia of right patella   . Esophageal reflux    no Endo to date  . Hyperlipemia   . Hypertension   . IBS (irritable bowel syndrome)    Dr Sharlett Iles    Past Surgical History:  Procedure Laterality Date  . CHONDROPLASTY Right 01/03/2015   Procedure: CHONDROPLASTY;  Surgeon: Kathryne Hitch, MD;  Location: [facility];  Service: Orthopedics;  Laterality: Right;  . COLONOSCOPY  2013   for IBS, Dr Sharlett Iles  . hydrocele      surgery @ age 82  . KNEE ARTHROSCOPY WITH MEDIAL MENISECTOMY Right 01/03/2015   Procedure: RIGHT KNEE ARTHROSCOPY CHONDROPLASTY WITH MEDIAL MENISCECTOMY;  Surgeon: Kathryne Hitch, MD;  Location: [facility];  Service: Orthopedics;  Laterality: Right;  . TONSILLECTOMY AND ADENOIDECTOMY      Social History   Tobacco Use  . Smoking status: Never Smoker  . Smokeless tobacco: Never Used  Substance Use Topics  . Alcohol use: Yes    Comment: 2-3 /week  . Drug use: No    Family History  Problem Relation Age of Onset  . Hyperlipidemia Mother   .  Hyperlipidemia Father   . Hypertension Father   . Heart attack Father   . Colon cancer Neg Hx   . Diabetes Neg Hx   . Stroke Neg Hx   . Heart disease Neg Hx   . Cancer Neg Hx     No Active Allergies  Medication list has been reviewed and updated.  Current Outpatient Medications on File Prior to Visit  Medication Sig Dispense Refill  . amLODipine (NORVASC) 2.5 MG tablet Take 1 tablet (2.5 mg total) by mouth daily. 90 tablet 3  . aspirin EC 81 MG tablet Take 81 mg by mouth daily.    Marland Kitchen olmesartan (BENICAR) 5 MG tablet Take 2 tablets (10 mg total) by mouth daily. 180 tablet 3  . Omega-3 Fatty Acids (FISH OIL BURP-LESS) 1000 MG CAPS Take by mouth.     No current facility-administered  medications on file prior to visit.     Review of Systems:  As per HPI- otherwise negative.   Physical Examination: Vitals:   06/05/19 1430  BP: (!) 142/82  Pulse: (!) 104  Resp: 17  Temp: 98.3 F (36.8 C)  SpO2: 98%   Vitals:   06/05/19 1430  Weight: 178 lb (80.7 kg)  Height: 5' 9.5" (1.765 m)   Body mass index is 25.91 kg/m. Ideal Body Weight: Weight in (lb) to have BMI = 25: 171.4  GEN: WDWN, NAD, Non-toxic, A & O x 3 HEENT: Atraumatic, Normocephalic. Neck supple. No masses, No LAD. Ears and Nose: No external deformity. CV: RRR, No M/G/R. No JVD. No thrill. No extra heart sounds. PULM: CTA B, no wheezes, crackles, rhonchi. No retractions. No resp. distress. No accessory muscle use. ABD: S, NT, ND, +BS. No rebound. No HSM. EXTR: No c/c/e NEURO Normal gait.  PSYCH: Normally interactive. Conversant. Not depressed or anxious appearing.  Calm demeanor.    Assessment and Plan: Physical exam - Plan: CBC, Comprehensive metabolic panel  Benign essential hypertension  Screening for diabetes mellitus - Plan: Comprehensive metabolic panel, Hemoglobin A1c  Screening for deficiency anemia - Plan: CBC  Dyslipidemia - Plan: Lipid panel  Screening for prostate cancer - Plan: PSA  B12 deficiency - Plan: B12  Screening for HIV (human immunodeficiency virus) - Plan: HIV Antibody (routine testing w rflx)  Varicose veins of left lower extremity, unspecified whether complicated - Plan: Ambulatory referral to Vascular Surgery  Needs flu shot - Plan: Flu Vaccine QUAD 6+ mos PF IM (Fluarix Quad PF)  Here today for a routine CPE Labs pending as above Flu shot given Defer shingrix Referral to vascular surgery Will plan further follow- up pending labs.   Signed Lamar Blinks, MD  Received his labs 10/20- message to pt  Results for orders placed or performed in visit on 06/05/19  CBC  Result Value Ref Range   WBC 5.9 4.0 - 10.5 K/uL   RBC 4.29 4.22 - 5.81 Mil/uL    Platelets 302.0 150.0 - 400.0 K/uL   Hemoglobin 14.6 13.0 - 17.0 g/dL   HCT 43.1 39.0 - 52.0 %   MCV 100.4 (H) 78.0 - 100.0 fl   MCHC 33.9 30.0 - 36.0 g/dL   RDW 12.7 11.5 - 15.5 %  Comprehensive metabolic panel  Result Value Ref Range   Sodium 139 135 - 145 mEq/L   Potassium 4.0 3.5 - 5.1 mEq/L   Chloride 104 96 - 112 mEq/L   CO2 27 19 - 32 mEq/L   Glucose, Bld 120 (H) 70 - 99 mg/dL  BUN 19 6 - 23 mg/dL   Creatinine, Ser 0.94 0.40 - 1.50 mg/dL   Total Bilirubin 0.6 0.2 - 1.2 mg/dL   Alkaline Phosphatase 33 (L) 39 - 117 U/L   AST 55 (H) 0 - 37 U/L   ALT 57 (H) 0 - 53 U/L   Total Protein 6.7 6.0 - 8.3 g/dL   Albumin 4.6 3.5 - 5.2 g/dL   Calcium 9.5 8.4 - 10.5 mg/dL   GFR 82.78 >60.00 mL/min  Hemoglobin A1c  Result Value Ref Range   Hgb A1c MFr Bld 5.7 4.6 - 6.5 %  Lipid panel  Result Value Ref Range   Cholesterol 232 (H) 0 - 200 mg/dL   Triglycerides 156.0 (H) 0.0 - 149.0 mg/dL   HDL 67.30 >39.00 mg/dL   VLDL 31.2 0.0 - 40.0 mg/dL   LDL Cholesterol 133 (H) 0 - 99 mg/dL   Total CHOL/HDL Ratio 3    NonHDL 164.52   HIV Antibody (routine testing w rflx)  Result Value Ref Range   HIV 1&2 Ab, 4th Generation NON-REACTIVE NON-REACTI  PSA  Result Value Ref Range   PSA 0.57 0.10 - 4.00 ng/mL  B12  Result Value Ref Range   Vitamin B-12 440 211 - 911 pg/mL    PSA looks fine- stable from previous  Lab Results  Component Value Date   PSA 0.57 06/05/2019   PSA 0.58 05/26/2018   PSA 0.57 04/14/2017   B12 level ok  HIV negative as expected   Your cholesterol is not bad, although LDL is a bit higher than goal. Your 10 year estimated risk of cardiovascular disease is ok, as below:   The 10-year ASCVD risk score Mikey Bussing DC Jr., et al., 2013) is: 6.9%   Values used to calculate the score:     Age: 60 years     Sex: Male     Is Non-Hispanic African American: No     Diabetic: No     Tobacco smoker: No     Systolic Blood Pressure: [id number] mmHg     Is BP treated: Yes     HDL  Cholesterol: 67.3 mg/dL     Total Cholesterol: 232 mg/dL  If your risk goes over 7% I will suggest a cholesterol med for you. We will monitor A1c- average blood sugar- is in the prediabetes range.  This means you may be at higher risk of developing diabetes later on.  We will monitor  Metabolic profile looks ok except for slight elevation of your liver tests AST and ALT.  Have you been drinking more alcohol than usual during the pandemic or taking tylenol for aches and pains?  Please let me know!  I would like to repeat your liver levels in 2-3 months--  will order this test for you as a lab visit only  Blood counts look ok   Take care, please see me in about 6 months

## 2019-06-03 NOTE — Patient Instructions (Signed)
It was great to see you again today, I will be in touch with your labs ASAP We will set you up to see vascular surgery to discuss your varicose veins on the left    Health Maintenance, Male Adopting a healthy lifestyle and getting preventive care are important in promoting health and wellness. Ask your health care provider about:  The right schedule for you to have regular tests and exams.  Things you can do on your own to prevent diseases and keep yourself healthy. What should I know about diet, weight, and exercise? Eat a healthy diet   Eat a diet that includes plenty of vegetables, fruits, low-fat dairy products, and lean protein.  Do not eat a lot of foods that are high in solid fats, added sugars, or sodium. Maintain a healthy weight Body mass index (BMI) is a measurement that can be used to identify possible weight problems. It estimates body fat based on height and weight. Your health care provider can help determine your BMI and help you achieve or maintain a healthy weight. Get regular exercise Get regular exercise. This is one of the most important things you can do for your health. Most adults should:  Exercise for at least 150 minutes each week. The exercise should increase your heart rate and make you sweat (moderate-intensity exercise).  Do strengthening exercises at least twice a week. This is in addition to the moderate-intensity exercise.  Spend less time sitting. Even light physical activity can be beneficial. Watch cholesterol and blood lipids Have your blood tested for lipids and cholesterol at 56 years of age, then have this test every 5 years. You may need to have your cholesterol levels checked more often if:  Your lipid or cholesterol levels are high.  You are older than 56 years of age.  You are at high risk for heart disease. What should I know about cancer screening? Many types of cancers can be detected early and may often be prevented. Depending on  your health history and family history, you may need to have cancer screening at various ages. This may include screening for:  Colorectal cancer.  Prostate cancer.  Skin cancer.  Lung cancer. What should I know about heart disease, diabetes, and high blood pressure? Blood pressure and heart disease  High blood pressure causes heart disease and increases the risk of stroke. This is more likely to develop in people who have high blood pressure readings, are of African descent, or are overweight.  Talk with your health care provider about your target blood pressure readings.  Have your blood pressure checked: ? Every 3-5 years if you are 59-69 years of age. ? Every year if you are 28 years old or older.  If you are between the ages of 76 and 73 and are a current or former smoker, ask your health care provider if you should have a one-time screening for abdominal aortic aneurysm (AAA). Diabetes Have regular diabetes screenings. This checks your fasting blood sugar level. Have the screening done:  Once every three years after age 11 if you are at a normal weight and have a low risk for diabetes.  More often and at a younger age if you are overweight or have a high risk for diabetes. What should I know about preventing infection? Hepatitis B If you have a higher risk for hepatitis B, you should be screened for this virus. Talk with your health care provider to find out if you are at risk for  hepatitis B infection. Hepatitis C Blood testing is recommended for:  Everyone born from [birth year] through 1963.  Anyone with known risk factors for hepatitis C. Sexually transmitted infections (STIs)  You should be screened each year for STIs, including gonorrhea and chlamydia, if: ? You are sexually active and are younger than 56 years of age. ? You are older than 56 years of age and your health care provider tells you that you are at risk for this type of infection. ? Your sexual activity has  changed since you were last screened, and you are at increased risk for chlamydia or gonorrhea. Ask your health care provider if you are at risk.  Ask your health care provider about whether you are at high risk for HIV. Your health care provider may recommend a prescription medicine to help prevent HIV infection. If you choose to take medicine to prevent HIV, you should first get tested for HIV. You should then be tested every 3 months for as long as you are taking the medicine. Follow these instructions at home: Lifestyle  Do not use any products that contain nicotine or tobacco, such as cigarettes, e-cigarettes, and chewing tobacco. If you need help quitting, ask your health care provider.  Do not use street drugs.  Do not share needles.  Ask your health care provider for help if you need support or information about quitting drugs. Alcohol use  Do not drink alcohol if your health care provider tells you not to drink.  If you drink alcohol: ? Limit how much you have to 0-2 drinks a day. ? Be aware of how much alcohol is in your drink. In the [place] one drink equals one 12 oz bottle of beer (355 mL), one 5 oz glass of wine (148 mL), or one 1 oz glass of hard liquor (44 mL). General instructions  Schedule regular health, dental, and eye exams.  Stay current with your vaccines.  Tell your health care provider if: ? You often feel depressed. ? You have ever been abused or do not feel safe at home. Summary  Adopting a healthy lifestyle and getting preventive care are important in promoting health and wellness.  Follow your health care provider's instructions about healthy diet, exercising, and getting tested or screened for diseases.  Follow your health care provider's instructions on monitoring your cholesterol and blood pressure. This information is not intended to replace advice given to you by your health care provider. Make sure you discuss any questions you have with your  health care provider. Document Released: 01/30/2008 Document Revised: 07/27/2018 Document Reviewed: 07/27/2018 Elsevier Patient Education  2020 [employer].

## 2019-06-05 ENCOUNTER — Ambulatory Visit (INDEPENDENT_AMBULATORY_CARE_PROVIDER_SITE_OTHER): Payer: BC Managed Care – PPO | Admitting: Family Medicine

## 2019-06-05 ENCOUNTER — Other Ambulatory Visit: Payer: Self-pay

## 2019-06-05 ENCOUNTER — Encounter: Payer: Self-pay | Admitting: Family Medicine

## 2019-06-05 VITALS — BP 132/82 | HR 84 | Temp 98.3°F | Resp 17 | Ht 69.5 in | Wt 178.0 lb

## 2019-06-05 DIAGNOSIS — Z0000 Encounter for general adult medical examination without abnormal findings: Secondary | ICD-10-CM | POA: Diagnosis not present

## 2019-06-05 DIAGNOSIS — E538 Deficiency of other specified B group vitamins: Secondary | ICD-10-CM

## 2019-06-05 DIAGNOSIS — Z131 Encounter for screening for diabetes mellitus: Secondary | ICD-10-CM

## 2019-06-05 DIAGNOSIS — E785 Hyperlipidemia, unspecified: Secondary | ICD-10-CM

## 2019-06-05 DIAGNOSIS — Z130 Encounter for screening for diseases of the blood and blood-forming organs and certain disorders involving the immune mechanism: Secondary | ICD-10-CM

## 2019-06-05 DIAGNOSIS — Z125 Encounter for screening for malignant neoplasm of prostate: Secondary | ICD-10-CM | POA: Diagnosis not present

## 2019-06-05 DIAGNOSIS — Z114 Encounter for screening for human immunodeficiency virus [HIV]: Secondary | ICD-10-CM | POA: Diagnosis not present

## 2019-06-05 DIAGNOSIS — I10 Essential (primary) hypertension: Secondary | ICD-10-CM

## 2019-06-05 DIAGNOSIS — Z23 Encounter for immunization: Secondary | ICD-10-CM

## 2019-06-05 DIAGNOSIS — R7401 Elevation of levels of liver transaminase levels: Secondary | ICD-10-CM

## 2019-06-05 DIAGNOSIS — I8392 Asymptomatic varicose veins of left lower extremity: Secondary | ICD-10-CM

## 2019-06-06 ENCOUNTER — Encounter: Payer: Self-pay | Admitting: Family Medicine

## 2019-06-06 DIAGNOSIS — M25841 Other specified joint disorders, right hand: Secondary | ICD-10-CM

## 2019-06-06 LAB — COMPREHENSIVE METABOLIC PANEL
ALT: 57 U/L — ABNORMAL HIGH (ref 0–53)
AST: 55 U/L — ABNORMAL HIGH (ref 0–37)
Albumin: 4.6 g/dL (ref 3.5–5.2)
Alkaline Phosphatase: 33 U/L — ABNORMAL LOW (ref 39–117)
BUN: 19 mg/dL (ref 6–23)
CO2: 27 mEq/L (ref 19–32)
Calcium: 9.5 mg/dL (ref 8.4–10.5)
Chloride: 104 mEq/L (ref 96–112)
Creatinine, Ser: 0.94 mg/dL (ref 0.40–1.50)
GFR: 82.78 mL/min (ref >60.00)
Glucose, Bld: 120 mg/dL — ABNORMAL HIGH (ref 70–99)
Potassium: 4 mEq/L (ref 3.5–5.1)
Sodium: 139 mEq/L (ref 135–145)
Total Bilirubin: 0.6 mg/dL (ref 0.2–1.2)
Total Protein: 6.7 g/dL (ref 6.0–8.3)

## 2019-06-06 LAB — LIPID PANEL
Cholesterol: 232 mg/dL — ABNORMAL HIGH (ref 0–200)
HDL: 67.3 mg/dL (ref >39.00)
LDL Cholesterol: 133 mg/dL — ABNORMAL HIGH (ref 0–99)
NonHDL: 164.52
Total CHOL/HDL Ratio: 3
Triglycerides: 156 mg/dL — ABNORMAL HIGH (ref 0.0–149.0)
VLDL: 31.2 mg/dL (ref 0.0–40.0)

## 2019-06-06 LAB — CBC
HCT: 43.1 % (ref 39.0–52.0)
Hemoglobin: 14.6 g/dL (ref 13.0–17.0)
MCHC: 33.9 g/dL (ref 30.0–36.0)
MCV: 100.4 fl — ABNORMAL HIGH (ref 78.0–100.0)
Platelets: 302 10*3/uL (ref 150.0–400.0)
RBC: 4.29 Mil/uL (ref 4.22–5.81)
RDW: 12.7 % (ref 11.5–15.5)
WBC: 5.9 10*3/uL (ref 4.0–10.5)

## 2019-06-06 LAB — PSA: PSA: 0.57 ng/mL (ref 0.10–4.00)

## 2019-06-06 LAB — HEMOGLOBIN A1C: Hgb A1c MFr Bld: 5.7 % (ref 4.6–6.5)

## 2019-06-06 LAB — HIV ANTIBODY (ROUTINE TESTING W REFLEX): HIV 1&2 Ab, 4th Generation: NONREACTIVE

## 2019-06-06 LAB — VITAMIN B12: Vitamin B-12: 440 pg/mL (ref 211–911)

## 2019-06-06 NOTE — Addendum Note (Signed)
Addended by: Lamar Blinks C on: 06/06/2019 01:05 PM   Modules accepted: Orders

## 2019-06-20 ENCOUNTER — Encounter: Payer: Self-pay | Admitting: Orthopaedic Surgery

## 2019-06-20 ENCOUNTER — Ambulatory Visit: Payer: BC Managed Care – PPO | Admitting: Orthopaedic Surgery

## 2019-06-20 ENCOUNTER — Other Ambulatory Visit: Payer: Self-pay

## 2019-06-20 VITALS — Ht 70.0 in | Wt 178.0 lb

## 2019-06-20 DIAGNOSIS — M720 Palmar fascial fibromatosis [Dupuytren]: Secondary | ICD-10-CM

## 2019-06-20 NOTE — Progress Notes (Signed)
Office Visit Note   Patient: Brendan Sharp           Date of Birth: November 12, 1960           MRN: [id number] Visit Date: 06/20/2019              Requested by: Darreld Mclean, MD [address] [place],  [place] [address] PCP: Darreld Mclean, MD   Assessment & Plan: Visit Diagnoses:  1. Dupuytren's contracture     Plan: Impression is right hand Dupuytren's contracture with palmar cord along the long finger.  At this point, this is very asymptomatic.  If he becomes more symptomatic or is unable to lie his hand flat onto a table we will refer him to Dr. Fredna Dow.  Otherwise, follow-up with [place] as needed.  Follow-Up Instructions: Return if symptoms worsen or fail to improve.   Orders:  No orders of the defined types were placed in this encounter.  No orders of the defined types were placed in this encounter.     Procedures: No procedures performed   Clinical Data: No additional findings.   Subjective: Chief Complaint  Patient presents with  . Right Hand - Pain    HPI patient is a pleasant 58 year old right-hand-dominant gentleman who presents to our clinic today with palpable knots to the palm of his hand.  He noticed this approximately 2 months ago and has progressively worsened.  No specific injury or change in activity.  The only pain he has is when he is trying to grip something with that hand.  No weakness.  No numbness, tingling or burning.  Review of Systems as detailed in HPI.  All others reviewed and are negative.   Objective: Vital Signs: Ht 5\' 10"  (1.778 m)   Wt 178 lb (80.7 kg)   BMI 25.54 kg/m   Physical Exam well-developed and well-nourished gentleman in no acute distress.  Alert oriented x3.  Ortho Exam examination of his right hand reveals palpable palmar cord along the third metacarpal.  He is able to put the palm of his hand flat onto the tabletop.  He is neurovascularly intact distally  Specialty Comments:  No specialty  comments available.  Imaging: No new imaging   PMFS History: Patient Active Problem List   Diagnosis Date Noted  . Benign essential hypertension 10/14/2016  . DOE (dyspnea on exertion) 02/21/2014  . Nonspecific abnormal electrocardiogram (ECG) (EKG) 12/01/2011  . History of gastroesophageal reflux (GERD) 08/17/2011  . Dyslipidemia 06/01/2011  . Irritable bowel syndrome (IBS) 06/01/2011   Past Medical History:  Diagnosis Date  . Abnormal serum level of alkaline phosphatase 10/23/11   37  . Arthritis   . Chondromalacia of right patella   . Esophageal reflux    no Endo to date  . Hyperlipemia   . Hypertension   . IBS (irritable bowel syndrome)    Dr Sharlett Iles    Family History  Problem Relation Age of Onset  . Hyperlipidemia Mother   . Hyperlipidemia Father   . Hypertension Father   . Heart attack Father   . Colon cancer Neg Hx   . Diabetes Neg Hx   . Stroke Neg Hx   . Heart disease Neg Hx   . Cancer Neg Hx     Past Surgical History:  Procedure Laterality Date  . CHONDROPLASTY Right 01/03/2015   Procedure: CHONDROPLASTY;  Surgeon: Kathryne Hitch, MD;  Location: [facility];  Service: Orthopedics;  Laterality: Right;  .  COLONOSCOPY  2013   for IBS, Dr Sharlett Iles  . hydrocele      surgery @ age 80  . KNEE ARTHROSCOPY WITH MEDIAL MENISECTOMY Right 01/03/2015   Procedure: RIGHT KNEE ARTHROSCOPY CHONDROPLASTY WITH MEDIAL MENISCECTOMY;  Surgeon: Kathryne Hitch, MD;  Location: [facility];  Service: Orthopedics;  Laterality: Right;  . TONSILLECTOMY AND ADENOIDECTOMY     Social History   Occupational History  . Occupation: Higher education careers adviser: [employer]  Tobacco Use  . Smoking status: Never Smoker  . Smokeless tobacco: Never Used  Substance and Sexual Activity  . Alcohol use: Yes    Comment: 2-3 /week  . Drug use: No  . Sexual activity: Not on file

## 2019-07-04 ENCOUNTER — Encounter: Payer: Self-pay | Admitting: Orthopaedic Surgery

## 2019-07-04 ENCOUNTER — Other Ambulatory Visit: Payer: Self-pay

## 2019-07-04 DIAGNOSIS — I83892 Varicose veins of left lower extremities with other complications: Secondary | ICD-10-CM

## 2019-07-04 NOTE — Telephone Encounter (Signed)
Yes Brendan Sharp.  Thanks.

## 2019-07-05 ENCOUNTER — Other Ambulatory Visit: Payer: Self-pay | Admitting: Radiology

## 2019-07-05 DIAGNOSIS — M720 Palmar fascial fibromatosis [Dupuytren]: Secondary | ICD-10-CM

## 2019-07-07 ENCOUNTER — Other Ambulatory Visit: Payer: Self-pay

## 2019-07-07 ENCOUNTER — Ambulatory Visit (HOSPITAL_COMMUNITY)
Admission: RE | Admit: 2019-07-07 | Discharge: 2019-07-07 | Disposition: A | Discharge status: Home / Self Care | Payer: BC Managed Care – PPO | Source: Ambulatory Visit | Attending: Family | Admitting: Family

## 2019-07-07 ENCOUNTER — Ambulatory Visit: Payer: BC Managed Care – PPO | Admitting: Physician Assistant

## 2019-07-07 VITALS — BP 114/81 | HR 71 | Temp 98.2°F | Resp 20 | Ht 70.0 in | Wt 180.0 lb

## 2019-07-07 DIAGNOSIS — I83892 Varicose veins of left lower extremities with other complications: Secondary | ICD-10-CM | POA: Insufficient documentation

## 2019-07-07 DIAGNOSIS — I872 Venous insufficiency (chronic) (peripheral): Secondary | ICD-10-CM

## 2019-07-07 NOTE — Progress Notes (Signed)
[facility]   Reason for referral: Left leg large varicose veins  History of Present Illness  Brendan Sharp is a 58 y.o. male who presents with chief complaint: feeling muscle twitches in the left calf  The patient has had no history of DVT, + history of varicose vein, no history of venous stasis ulcers, no history of  Lymphedema and no history of skin changes in lower legs.  There is a family history of venous disorders.  The patient has not used compression stockings in the past.  Past Medical History:  Diagnosis Date  . Abnormal serum level of alkaline phosphatase 10/23/11   37  . Arthritis   . Chondromalacia of right patella   . Esophageal reflux    no Endo to date  . Hyperlipemia   . Hypertension   . IBS (irritable bowel syndrome)    Dr Sharlett Iles    Past Surgical History:  Procedure Laterality Date  . CHONDROPLASTY Right 01/03/2015   Procedure: CHONDROPLASTY;  Surgeon: Kathryne Hitch, MD;  Location: [facility];  Service: Orthopedics;  Laterality: Right;  . COLONOSCOPY  2013   for IBS, Dr Sharlett Iles  . hydrocele      surgery @ age 77  . KNEE ARTHROSCOPY WITH MEDIAL MENISECTOMY Right 01/03/2015   Procedure: RIGHT KNEE ARTHROSCOPY CHONDROPLASTY WITH MEDIAL MENISCECTOMY;  Surgeon: Kathryne Hitch, MD;  Location: [facility];  Service: Orthopedics;  Laterality: Right;  . TONSILLECTOMY AND ADENOIDECTOMY      Social History   Socioeconomic History  . Marital status: Married    Spouse name: Not on file  . Number of children: 1  . Years of education: MS  . Highest education level: Not on file  Occupational History  . Occupation: Higher education careers adviser: [employer]  Social Needs  . Financial resource strain: Not on file  . Food insecurity    Worry: Not on file    Inability: Not on file  . Transportation needs    Medical: Not on file    Non-medical: Not on file  Tobacco Use  . Smoking status: Never Smoker   . Smokeless tobacco: Never Used  Substance and Sexual Activity  . Alcohol use: Yes    Comment: 2-3 /week  . Drug use: No  . Sexual activity: Not on file  Lifestyle  . Physical activity    Days per week: Not on file    Minutes per session: Not on file  . Stress: Not on file  Relationships  . Social Herbalist on phone: Not on file    Gets together: Not on file    Attends religious service: Not on file    Active member of club or organization: Not on file    Attends meetings of clubs or organizations: Not on file    Relationship status: Not on file  . Intimate partner violence    Fear of current or ex partner: Not on file    Emotionally abused: Not on file    Physically abused: Not on file    Forced sexual activity: Not on file  Other Topics Concern  . Not on file  Social History Narrative   Caffeine daily    He works as an Art gallery manager.   Lives with wife in a 2 story home.    Has one child.    Education: [employer]    Family History  Problem Relation Age of  Onset  . Hyperlipidemia Mother   . Hyperlipidemia Father   . Hypertension Father   . Heart attack Father   . Colon cancer Neg Hx   . Diabetes Neg Hx   . Stroke Neg Hx   . Heart disease Neg Hx   . Cancer Neg Hx     Current Outpatient Medications on File Prior to Visit  Medication Sig Dispense Refill  . amLODipine (NORVASC) 2.5 MG tablet Take 1 tablet (2.5 mg total) by mouth daily. 90 tablet 3  . aspirin EC 81 MG tablet Take 81 mg by mouth daily.    Marland Kitchen olmesartan (BENICAR) 5 MG tablet Take 2 tablets (10 mg total) by mouth daily. 180 tablet 3  . Omega-3 Fatty Acids (FISH OIL BURP-LESS) 1000 MG CAPS Take by mouth.     No current facility-administered medications on file prior to visit.     Allergies as of 07/07/2019  . (No Known Allergies)     ROS:   General:  No weight loss, Fever, chills  HEENT: No recent headaches, no nasal bleeding, no visual changes, no sore throat  Neurologic: No  dizziness, blackouts, seizures. No recent symptoms of stroke or mini- stroke. No recent episodes of slurred speech, or temporary blindness.  Cardiac: No recent episodes of chest pain/pressure, no shortness of breath at rest.  History  shortness of breath with exertion.  Denies history of atrial fibrillation or irregular heartbeat  Vascular: No history of rest pain in feet.  No history of claudication.  No history of non-healing ulcer, No history of DVT   Pulmonary: No home oxygen, no productive cough, no hemoptysis,  No asthma or wheezing  Musculoskeletal:  [ ]  Arthritis, [ ]  Low back pain,  [x ] Joint pain  Hematologic:No history of hypercoagulable state.  No history of easy bleeding.  No history of anemia  Gastrointestinal: No hematochezia or melena,  No gastroesophageal reflux, no trouble swallowing  Urinary: [ ]  chronic Kidney disease, [ ]  on HD - [ ]  MWF or [ ]  TTHS, [ ]  Burning with urination, [ ]  Frequent urination, [ ]  Difficulty urinating;   Skin: No rashes  Psychological: No history of anxiety,  No history of depression  Physical Examination  Vitals:   07/07/19 1350  BP: 114/81  Pulse: 71  Resp: 20  Temp: 98.2 F (36.8 C)  SpO2: 99%  Weight: 180 lb (81.6 kg)  Height: 5\' 10"  (1.778 m)    Body mass index is 25.83 kg/m.  General:  Alert and oriented, no acute distress HEENT: Normal Neck: No bruit or JVD Pulmonary: Clear to auscultation bilaterally Cardiac: Regular Rate and Rhythm without murmur Abdomen: Soft, non-tender, non-distended, no mass, no scars Skin: No rash, no discoloration, no open ulcers Extremity Pulses:  2+ radial, brachial, femoral, dorsalis pedis, posterior tibial pulses bilaterally Musculoskeletal:     Neurologic: Upper and lower extremity motor 5/5 and symmetric  DATA: Left Technical Findings: No evidence of DVT, SVT, or Baker's cyst. The sapheno-femoral junction is incompetent. The great saphenous vein is incompetent at the  sapheno-femoral junction only. The small saphenous vein demonstrates reflux throughout the calf and has branches that connect to the symptomatic areas. The femoral and popliteal veins demonstrate deep venous reflux.   +--------------+------+---------+--------+---------+ LEFT          RefluxReflux NoDiameterComments                 Yes                             +--------------+------+---------+--------+---------+  CFV                    no                      +--------------+------+---------+--------+---------+ FV prox        yes                   > 2000 ms +--------------+------+---------+--------+---------+ Popliteal      yes                   > 2000 ms +--------------+------+---------+--------+---------+ GSV at SFJ     yes                   >500 ms   +--------------+------+---------+--------+---------+ GSV prox thigh         no                      +--------------+------+---------+--------+---------+ GSV mid thigh          no                      +--------------+------+---------+--------+---------+ GSV dist thigh         no                      +--------------+------+---------+--------+---------+ GSV at knee            no                      +--------------+------+---------+--------+---------+ GSV prox calf          no                      +--------------+------+---------+--------+---------+ SSV Pop Fossa  yes                   >500 ms   +--------------+------+---------+--------+---------+ SSV prox calf  yes                   >500 ms   +--------------+------+---------+--------+---------+ SSV mid calf   yes                   >500 ms   +--------------+------+---------+--------+---------+       Summary: Right: No evidence of common femoral vein obstruction. Left: Evidence of chronic venous insufficiency is detected in the small saphenous vein, great saphenous vein, and deep venous system.  There is no evidence of deep vein thrombosis in the lower extremity.There is no evidence of superficial venous  Thrombosis.   Assessment: Venous reflux left LE Left: Evidence of chronic venous insufficiency is detected in the small saphenous vein, great saphenous vein, and deep venous system. There is no evidence of deep vein thrombosis in the lower extremity.There is no evidence of superficial venous  thrombosis. Plan:  I have recommended thigh high compression daily, continued exercise daily and elevation of LE when at rest.  He does not have symptoms of significant venous insufficieny.  No history of non healing ulcer or brawny skin discoloration, and no edema.    He can f/u in 3 months for consideration for stab phlebectomy verses laser ablation.  He is not at risk of limb loss.    Roxy Horseman PA-C [facility] Office: [phone]  MD in clinic [place]

## 2019-07-19 DIAGNOSIS — M720 Palmar fascial fibromatosis [Dupuytren]: Secondary | ICD-10-CM | POA: Diagnosis not present

## 2019-07-19 DIAGNOSIS — M79641 Pain in right hand: Secondary | ICD-10-CM | POA: Diagnosis not present

## 2019-07-28 ENCOUNTER — Encounter: Payer: Self-pay | Admitting: Family Medicine

## 2019-08-21 ENCOUNTER — Other Ambulatory Visit: Payer: Self-pay

## 2019-08-21 ENCOUNTER — Other Ambulatory Visit (INDEPENDENT_AMBULATORY_CARE_PROVIDER_SITE_OTHER): Payer: BC Managed Care – PPO

## 2019-08-21 DIAGNOSIS — R7401 Elevation of levels of liver transaminase levels: Secondary | ICD-10-CM | POA: Diagnosis not present

## 2019-08-21 LAB — HEPATIC FUNCTION PANEL
ALT: 68 U/L — ABNORMAL HIGH (ref 0–53)
AST: 48 U/L — ABNORMAL HIGH (ref 0–37)
Albumin: 4.8 g/dL (ref 3.5–5.2)
Alkaline Phosphatase: 33 U/L — ABNORMAL LOW (ref 39–117)
Bilirubin, Direct: 0.1 mg/dL (ref 0.0–0.3)
Total Bilirubin: 0.6 mg/dL (ref 0.2–1.2)
Total Protein: 7.2 g/dL (ref 6.0–8.3)

## 2019-08-22 ENCOUNTER — Encounter: Payer: Self-pay | Admitting: Family Medicine

## 2019-08-22 DIAGNOSIS — R7989 Other specified abnormal findings of blood chemistry: Secondary | ICD-10-CM

## 2019-08-25 ENCOUNTER — Other Ambulatory Visit: Payer: Self-pay

## 2019-08-25 ENCOUNTER — Ambulatory Visit (HOSPITAL_BASED_OUTPATIENT_CLINIC_OR_DEPARTMENT_OTHER)
Admission: RE | Admit: 2019-08-25 | Discharge: 2019-08-25 | Disposition: A | Discharge status: Home / Self Care | Payer: BC Managed Care – PPO | Source: Ambulatory Visit | Attending: Family Medicine | Admitting: Family Medicine

## 2019-08-25 DIAGNOSIS — R7989 Other specified abnormal findings of blood chemistry: Secondary | ICD-10-CM | POA: Diagnosis not present

## 2019-08-25 DIAGNOSIS — K7689 Other specified diseases of liver: Secondary | ICD-10-CM | POA: Diagnosis not present

## 2019-08-27 ENCOUNTER — Encounter: Payer: Self-pay | Admitting: Family Medicine

## 2019-09-22 ENCOUNTER — Encounter: Payer: Self-pay | Admitting: Family Medicine

## 2019-09-22 DIAGNOSIS — R21 Rash and other nonspecific skin eruption: Secondary | ICD-10-CM

## 2019-10-03 DIAGNOSIS — L308 Other specified dermatitis: Secondary | ICD-10-CM | POA: Diagnosis not present

## 2019-10-03 DIAGNOSIS — L718 Other rosacea: Secondary | ICD-10-CM | POA: Diagnosis not present

## 2019-12-25 ENCOUNTER — Encounter: Payer: Self-pay | Admitting: Family Medicine

## 2019-12-25 DIAGNOSIS — I10 Essential (primary) hypertension: Secondary | ICD-10-CM

## 2019-12-25 MED ORDER — AMLODIPINE BESYLATE 2.5 MG PO TABS: 2.5000 mg | ORAL_TABLET | Freq: Every day | ORAL | 3 refills | Status: AC

## 2019-12-25 MED ORDER — OLMESARTAN MEDOXOMIL 5 MG PO TABS: 10.0000 mg | ORAL_TABLET | Freq: Every day | ORAL | 3 refills | Status: DC

## 2020-02-26 ENCOUNTER — Encounter: Payer: Self-pay | Admitting: Family Medicine

## 2020-03-02 NOTE — Progress Notes (Signed)
[facility] [facility] [address] [place], [place] [address] [phone]  Date:  03/04/2020   Name:  Brendan Sharp   DOB:  01/05/1961   MRN:  [id number]  PCP:  Darreld Mclean, MD    Chief Complaint: No chief complaint on file.   History of Present Illness:  Brendan Sharp is a 59 y.o. very pleasant male patient who presents with the following:  Gentleman with history of hypertension, reflux, dyslipidemia, IBS, elevated alk phos  Here today for follow-up and medication review Virtual visit today, patient is at home and I am at office.  Patient identity confirmed with 2 factors, he gives consent for virtual visit today.  The patient and myself are present on the Last seen by myself in October of that we have exchanged a few MyChart messages since that time He has seen neurology previously for history of bilateral dysesthesias  He contacted me recently with concern of depression, is interested in possibly taking Lexapro.  He did some research and this medication appeal to him He has been under more stress work the last several months- he is actually looking for a new job.  He feels anxious and upset about going to work He has noted sx for about 6 weeks- dreading going to work, low energy, somewhat less interest in his favorite activities Not sleeping that well- his sleep is more fragmented than normal He may feel sad, but not tearful No SI  Amlodipine 2.5 Olmesartan 5 Baby aspirin Omega-3  Colon cancer screen up-to-date Covid series- done in March 3/22  4/12 [facility] done in October, recheck LFTs January-slightly out of needed  Patient plans to see me in person for his physical in October Patient Active Problem List   Diagnosis Date Noted  . Benign essential hypertension 10/14/2016  . DOE (dyspnea on exertion) 02/21/2014  . Nonspecific abnormal electrocardiogram (ECG) (EKG) 12/01/2011  . History of  gastroesophageal reflux (GERD) 08/17/2011  . Dyslipidemia 06/01/2011  . Irritable bowel syndrome (IBS) 06/01/2011    Past Medical History:  Diagnosis Date  . Abnormal serum level of alkaline phosphatase 10/23/11   37  . Arthritis   . Chondromalacia of right patella   . Esophageal reflux    no Endo to date  . Hyperlipemia   . Hypertension   . IBS (irritable bowel syndrome)    Dr Sharlett Iles    Past Surgical History:  Procedure Laterality Date  . CHONDROPLASTY Right 01/03/2015   Procedure: CHONDROPLASTY;  Surgeon: Kathryne Hitch, MD;  Location: [facility];  Service: Orthopedics;  Laterality: Right;  . COLONOSCOPY  2013   for IBS, Dr Sharlett Iles  . hydrocele      surgery @ age 32  . KNEE ARTHROSCOPY WITH MEDIAL MENISECTOMY Right 01/03/2015   Procedure: RIGHT KNEE ARTHROSCOPY CHONDROPLASTY WITH MEDIAL MENISCECTOMY;  Surgeon: Kathryne Hitch, MD;  Location: [facility];  Service: Orthopedics;  Laterality: Right;  . TONSILLECTOMY AND ADENOIDECTOMY      Social History   Tobacco Use  . Smoking status: Never Smoker  . Smokeless tobacco: Never Used  Vaping Use  . Vaping Use: Never used  Substance Use Topics  . Alcohol use: Yes    Comment: 2-3 /week  . Drug use: No    Family History  Problem Relation Age of Onset  . Hyperlipidemia Mother   . Hyperlipidemia Father   . Hypertension Father   . Heart  attack Father   . Colon cancer Neg Hx   . Diabetes Neg Hx   . Stroke Neg Hx   . Heart disease Neg Hx   . Cancer Neg Hx     No Known Allergies  Medication list has been reviewed and updated.  Current Outpatient Medications on File Prior to Visit  Medication Sig Dispense Refill  . amLODipine (NORVASC) 2.5 MG tablet Take 1 tablet (2.5 mg total) by mouth daily. 90 tablet 3  . aspirin EC 81 MG tablet Take 81 mg by mouth daily.    Marland Kitchen olmesartan (BENICAR) 5 MG tablet Take 2 tablets (10 mg total) by mouth daily. 180 tablet 3  . Omega-3 Fatty Acids (FISH OIL  BURP-LESS) 1000 MG CAPS Take by mouth.     No current facility-administered medications on file prior to visit.    Review of Systems:  As per HPI- otherwise negative.   Physical Examination: There were no vitals filed for this visit. There were no vitals filed for this visit. There is no height or weight on file to calculate BMI. Ideal Body Weight:    Patient observed over video.  He looks well, his normal self.  No cough, wheezing, shortness of breath is noted   Assessment and Plan: Adjustment disorder with mixed anxiety and depressed mood - Plan: escitalopram (LEXAPRO) 10 MG tablet   Virtual visit today.  Patient and I connected over video, video used for the entirety of our visit He has been under increased stress and has developed symptoms of depression and anxiety over the last approximately 6 weeks.  Most of his stressors are connected to his work.  He has started putting out some fevers for new job, but this will take time.  He is interested in starting an SSRI, especially is interested in starting Lexapro which I think is a fine option.  We will start her on 10 mg, increase to 20 after 2 to 3 weeks assuming.  We went over most likely side effects including sexual dysfunction, nausea and loose stools.  Also went over black box warning regarding suicidal ideation and action.  He will let me know how he is tolerating this medication, assuming all is well we will plan to visit for his physical in October.  If medication is not working or he has any other concerns or he is getting worse he will contact me sooner This visit occurred during the SARS-CoV-2 public health emergency.  Safety protocols were in place, including screening questions prior to the visit, additional usage of staff PPE, and extensive cleaning of exam room while observing appropriate contact time as indicated for disinfecting solutions.    Signed Lamar Blinks, MD

## 2020-03-04 ENCOUNTER — Other Ambulatory Visit: Payer: Self-pay

## 2020-03-04 ENCOUNTER — Encounter: Payer: Self-pay | Admitting: Family Medicine

## 2020-03-04 ENCOUNTER — Telehealth (INDEPENDENT_AMBULATORY_CARE_PROVIDER_SITE_OTHER): Payer: BC Managed Care – PPO | Admitting: Family Medicine

## 2020-03-04 DIAGNOSIS — F4323 Adjustment disorder with mixed anxiety and depressed mood: Secondary | ICD-10-CM | POA: Diagnosis not present

## 2020-03-04 MED ORDER — ESCITALOPRAM OXALATE 10 MG PO TABS: 10.0000 mg | ORAL_TABLET | Freq: Every day | ORAL | 3 refills | Status: DC

## 2020-03-15 ENCOUNTER — Emergency Department (HOSPITAL_BASED_OUTPATIENT_CLINIC_OR_DEPARTMENT_OTHER)
Admission: EM | Admit: 2020-03-15 | Discharge: 2020-03-15 | Disposition: A | Discharge status: Home / Self Care | Payer: BC Managed Care – PPO | Attending: Emergency Medicine | Admitting: Emergency Medicine

## 2020-03-15 ENCOUNTER — Other Ambulatory Visit: Payer: Self-pay

## 2020-03-15 ENCOUNTER — Encounter (HOSPITAL_BASED_OUTPATIENT_CLINIC_OR_DEPARTMENT_OTHER): Payer: Self-pay | Admitting: *Deleted

## 2020-03-15 DIAGNOSIS — F410 Panic disorder [episodic paroxysmal anxiety] without agoraphobia: Secondary | ICD-10-CM | POA: Insufficient documentation

## 2020-03-15 DIAGNOSIS — Z5321 Procedure and treatment not carried out due to patient leaving prior to being seen by health care provider: Secondary | ICD-10-CM | POA: Diagnosis not present

## 2020-03-15 NOTE — ED Triage Notes (Addendum)
Started shaking 1 hour ago  stills feel shaky  But not has bad  No food this am  Coffee and meds only Denies pain  Uncomfortable in upper abd

## 2020-03-15 NOTE — ED Notes (Signed)
Patient said he was having a panic attack b/c he is shaking. No food, took med and coffee upon arrival.

## 2020-03-16 ENCOUNTER — Encounter: Payer: Self-pay | Admitting: Family Medicine

## 2020-03-17 MED ORDER — FLUOXETINE HCL 20 MG PO CAPS: 20.0000 mg | ORAL_CAPSULE | Freq: Every day | ORAL | 4 refills | Status: DC

## 2020-06-18 DIAGNOSIS — R110 Nausea: Secondary | ICD-10-CM | POA: Diagnosis not present

## 2020-06-18 DIAGNOSIS — F410 Panic disorder [episodic paroxysmal anxiety] without agoraphobia: Secondary | ICD-10-CM | POA: Diagnosis not present

## 2020-06-18 DIAGNOSIS — L74512 Primary focal hyperhidrosis, palms: Secondary | ICD-10-CM | POA: Diagnosis not present

## 2020-06-18 DIAGNOSIS — Z13228 Encounter for screening for other metabolic disorders: Secondary | ICD-10-CM | POA: Diagnosis not present

## 2020-06-18 DIAGNOSIS — E785 Hyperlipidemia, unspecified: Secondary | ICD-10-CM | POA: Diagnosis not present

## 2020-06-18 DIAGNOSIS — Z23 Encounter for immunization: Secondary | ICD-10-CM | POA: Diagnosis not present

## 2020-06-18 DIAGNOSIS — R251 Tremor, unspecified: Secondary | ICD-10-CM | POA: Diagnosis not present

## 2020-06-18 DIAGNOSIS — R1010 Upper abdominal pain, unspecified: Secondary | ICD-10-CM | POA: Diagnosis not present

## 2020-06-18 DIAGNOSIS — I10 Essential (primary) hypertension: Secondary | ICD-10-CM | POA: Diagnosis not present

## 2020-07-10 DIAGNOSIS — F410 Panic disorder [episodic paroxysmal anxiety] without agoraphobia: Secondary | ICD-10-CM | POA: Diagnosis not present

## 2020-07-10 DIAGNOSIS — F4322 Adjustment disorder with anxiety: Secondary | ICD-10-CM | POA: Diagnosis not present

## 2020-07-10 DIAGNOSIS — Z0184 Encounter for antibody response examination: Secondary | ICD-10-CM | POA: Diagnosis not present

## 2020-07-10 DIAGNOSIS — I10 Essential (primary) hypertension: Secondary | ICD-10-CM | POA: Diagnosis not present

## 2020-07-10 DIAGNOSIS — A048 Other specified bacterial intestinal infections: Secondary | ICD-10-CM | POA: Diagnosis not present

## 2020-07-10 DIAGNOSIS — K7581 Nonalcoholic steatohepatitis (NASH): Secondary | ICD-10-CM | POA: Diagnosis not present

## 2020-10-23 ENCOUNTER — Encounter: Payer: Self-pay | Admitting: Nurse Practitioner

## 2020-11-08 ENCOUNTER — Ambulatory Visit: Payer: BC Managed Care – PPO | Admitting: Nurse Practitioner

## 2020-11-08 ENCOUNTER — Other Ambulatory Visit (INDEPENDENT_AMBULATORY_CARE_PROVIDER_SITE_OTHER): Payer: BC Managed Care – PPO

## 2020-11-08 ENCOUNTER — Encounter: Payer: Self-pay | Admitting: Nurse Practitioner

## 2020-11-08 VITALS — BP 106/76 | HR 75 | Ht 70.0 in | Wt 188.0 lb

## 2020-11-08 DIAGNOSIS — R7989 Other specified abnormal findings of blood chemistry: Secondary | ICD-10-CM | POA: Diagnosis not present

## 2020-11-08 LAB — IBC PANEL
Iron: 88 ug/dL (ref 42–165)
Saturation Ratios: 23.3 % (ref 20.0–50.0)
Transferrin: 270 mg/dL (ref 212.0–360.0)

## 2020-11-08 LAB — FERRITIN: Ferritin: 182 ng/mL (ref 22.0–322.0)

## 2020-11-08 NOTE — Patient Instructions (Addendum)
If you are age 58 or older, your body mass index should be between 23-30. Your Body mass index is 26.98 kg/m. If this is out of the aforementioned range listed, please consider follow up with your Primary Care Provider.  If you are age 30 or younger, your body mass index should be between 19-25. Your Body mass index is 26.98 kg/m. If this is out of the aformentioned range listed, please consider follow up with your Primary Care Provider.   Your provider has requested that you go to the basement level for lab work before leaving today. Press "B" on the elevator. The lab is located at the first door on the left as you exit the elevator.  Follow up pending the results of your labs.  Thank you for entrusting me with your care and choosing [facility].  [name], NP-C

## 2020-11-08 NOTE — Progress Notes (Signed)
ASSESSMENT AND PLAN    # Mildly elevated liver enzymes, fatty liver on [place].  --ASMA negative. HAV, HBV and HCV serologies negative. He has received 2 of 3 HBV vaccines. Recommend he get HAV vaccination as well.  --He consumes only 1 glass of wine a day --Obtain remainder of hepatic serologic workup to evaluate for etiologies of chronic liver disease.  --Will call him with results. In the meantime he will work on mild weight loss.    # H.pylori infection. Treated and follow up H.pylori stool antigen negative in Feb 2022.   # GERD, asymptomatic on pantoprazole  # Colon cancer screening. Due again in Feb 2023.   HISTORY OF PRESENT ILLNESS     Chief Complaint : elevated liver tests  Brendan Sharp is a 60 y.o. male from [place] , previously known to Dr. Sharlett Iles (2013) with a past medical history significant for diverticulosis, hyperlipidemia, hypertension, GERD, anxiety, depression, steatosis, H.pylori infection  Patient referred by PCP for evaluation of abnormal liver chemistries.  Since at least October 2020 his liver enzymes have been elevated at 2 x ULN or below.  RUQ ultrasound in 2021 and another February 2022 suggest steatosis. He drinks ~ 1 glass a wine a day.  Viral hepatitis serologies negative.  Anti-smooth muscle antibody negative .  Triglycerides 138 . Patient is in process of getting HPV vaccine and has received 2 out of 3 doses.  No family history of liver disease.  Patient has no GI complaints such as bowel changes, blood in stool or abdominal pain.  He takes Lipitor but that was started only last month.  Patient diagnosed with H. Pylori infection November 2021.  He completed antibiotics and follow-up H. pylori stool antigen was negative.   Data Reviewed:  October 2020  AST 55, ALT 57  January 2021 AST 48, ALT 68   November 2021  AST 71 AT 60 ASMA negative  Feb 2022  AST 93, ALT 76   PREVIOUS EVALUATIONS:   February 2013 screening colonoscopy -No  polyps or cancer  January 2021 RUQ ultrasound -Probably hepatic steatosis  Feb 2022 RUQ US --Hepatic steatosis --Gallbladder unremarkable.    Past Medical History:  Diagnosis Date   60  . Arthritis   . Chondromalacia of right patella   . Esophageal reflux    no Endo to date  . Hyperlipemia   . Hypertension   . IBS (irritable bowel syndrome)    Dr Sharlett Iles     Past Surgical History:  Procedure Laterality Date  . CHONDROPLASTY Right 01/03/2015   Procedure: CHONDROPLASTY;  Surgeon: Kathryne Hitch, MD;  Location: [facility];  Service: Orthopedics;  Laterality: Right;  . COLONOSCOPY  2013   for IBS, Dr Sharlett Iles  . hydrocele      surgery @ age 54  . KNEE ARTHROSCOPY WITH MEDIAL MENISECTOMY Right 01/03/2015   Procedure: RIGHT KNEE ARTHROSCOPY CHONDROPLASTY WITH MEDIAL MENISCECTOMY;  Surgeon: Kathryne Hitch, MD;  Location: [facility];  Service: Orthopedics;  Laterality: Right;  . TONSILLECTOMY AND ADENOIDECTOMY     Family History  Problem Relation Age of Onset  . Hyperlipidemia Mother   . Hyperlipidemia Father   . Hypertension Father   . Heart attack Father   . Colon cancer Neg Hx   . Diabetes Neg Hx   . Stroke Neg Hx   . Heart disease Neg Hx   . Cancer Neg Hx    Social History   Tobacco Use  .  Smoking status: Never Smoker  . Smokeless tobacco: Never Used  Vaping Use  . Vaping Use: Never used  Substance Use Topics  . Alcohol use: Yes    Comment: 2-3 /week  . Drug use: No   Current Outpatient Medications  Medication Sig Dispense Refill  . amLODipine (NORVASC) 2.5 MG tablet Take 1 tablet (2.5 mg total) by mouth daily. 90 tablet 3  . aspirin EC 81 MG tablet Take 81 mg by mouth daily.    Marland Kitchen atorvastatin (LIPITOR) 20 MG tablet Take 20 mg by mouth daily.    Marland Kitchen buPROPion (WELLBUTRIN SR) 100 MG 12 hr tablet Take 100 mg by mouth 2 (two) times daily.    . Cholecalciferol 50 MCG (2000 UT) CAPS Take 1 capsule by mouth daily.    . hydrOXYzine  (ATARAX/VISTARIL) 10 MG tablet Take by mouth.    . olmesartan (BENICAR) 5 MG tablet Take 2 tablets (10 mg total) by mouth daily. 180 tablet 3  . Omega-3 Fatty Acids (FISH OIL BURP-LESS) 1000 MG CAPS Take by mouth.    . pantoprazole (PROTONIX) 40 MG tablet Take 40 mg by mouth daily.     No current facility-administered medications for this visit.   No Known Allergies   Review of Systems: All systems reviewed and negative except where noted in HPI.   PHYSICAL EXAM :    Wt Readings from Last 3 Encounters:  11/08/20 188 lb (85.3 kg)  03/15/20 176 lb 5.9 oz (80 kg)  07/07/19 180 lb (81.6 kg)    Ht 5\' 10"  (1.778 m)   Wt 188 lb (85.3 kg)   BMI 26.98 kg/m  Constitutional:  Pleasant male in no acute distress. Psychiatric: Normal mood and affect. Behavior is normal. EENT: Pupils normal.  Conjunctivae are normal. No scleral icterus. Neck supple.  Cardiovascular: Normal rate, regular rhythm. No edema Pulmonary/chest: Effort normal and breath sounds normal. No wheezing, rales or rhonchi. Abdominal: Soft, nondistended, nontender. Bowel sounds active throughout. There are no masses palpable. No hepatomegaly. Neurological: Alert and oriented to person place and time. Skin: Skin is warm and dry. No rashes noted.  [name], NP  11/08/2020, 8:33 AM  Cc:  Referring Provider Yisroel Ramming, MD

## 2020-11-12 LAB — MITOCHONDRIAL ANTIBODIES: Mitochondrial M2 Ab, IgG: <=20 U

## 2020-11-12 LAB — ANA: Anti Nuclear Antibody (ANA): NEGATIVE

## 2020-11-12 LAB — ALPHA-1-ANTITRYPSIN: A-1 Antitrypsin, Ser: 135 mg/dL (ref 83–199)

## 2020-11-12 LAB — TISSUE TRANSGLUTAMINASE, IGA: (tTG) Ab, IgA: <1 U/mL

## 2020-11-12 LAB — IGA: Immunoglobulin A: 183 mg/dL (ref 47–310)

## 2020-11-12 LAB — CERULOPLASMIN: Ceruloplasmin: 27 mg/dL (ref 18–36)

## 2020-11-12 NOTE — Progress Notes (Signed)
Attending Physician's Attestation   I have reviewed the chart.   I agree with the Advanced Practitioner's note, impression, and recommendations with any updates as below. If liver biochemical testing does not improve over the course of the next 6 months, consideration of a liver biopsy to confirm nothing else other than metabolic associated liver disease is reasonable.  We will see how his labs look.   Justice Britain, MD [facility] Advanced Endoscopy Office # [phone]

## 2020-11-15 ENCOUNTER — Telehealth: Payer: Self-pay | Admitting: Nurse Practitioner

## 2020-11-15 DIAGNOSIS — R7989 Other specified abnormal findings of blood chemistry: Secondary | ICD-10-CM

## 2020-11-15 NOTE — Telephone Encounter (Signed)
Spoke with pt and he is aware of results. Orders and reminder in epic.

## 2020-11-15 NOTE — Telephone Encounter (Signed)
Pt calling for lab results.

## 2020-11-15 NOTE — Telephone Encounter (Signed)
Brendan Sharp, please let patient know that all of his studies were negative.  The only cause for the mildly abnormal liver enzymes at this point is fatty liver disease.  He and I had a discussion about weight loss.  I would also recommend no alcohol.  Please ask him to return for LFTs in 6 months.  If liver enzymes are still elevated then he may need a liver biopsy. Thanks

## 2021-05-19 ENCOUNTER — Other Ambulatory Visit: Payer: Self-pay

## 2021-12-14 IMAGING — US US ABDOMEN LIMITED
1 series · 14 of 25 positions shown · non-contrast
Comparison: No pertinent prior studies available for comparison.

CLINICAL DATA: Elevated LFTs.  Transaminitis.

EXAM:
ULTRASOUND ABDOMEN LIMITED RIGHT UPPER QUADRANT

[Series 1: us abdomen limited · 14 of 25 slices shown]
[im 1/25]
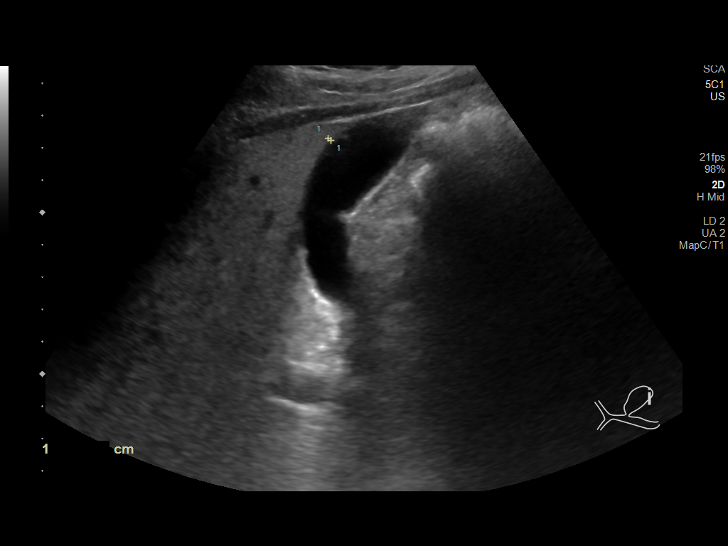
[im 3/25]
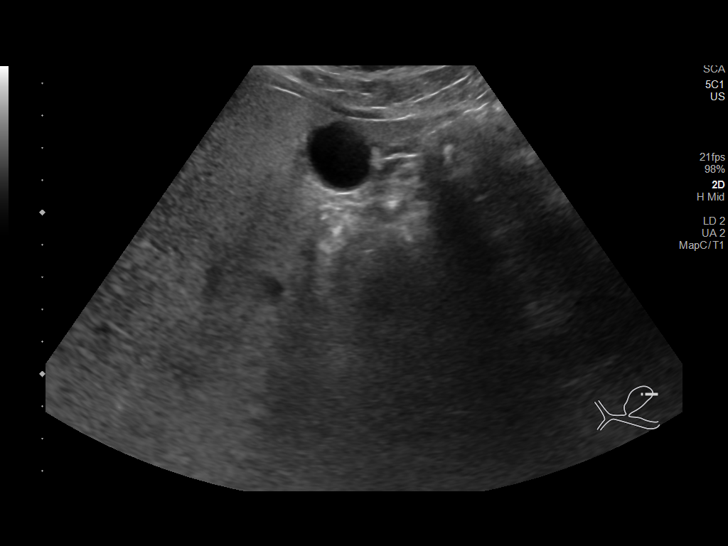
[im 5/25]
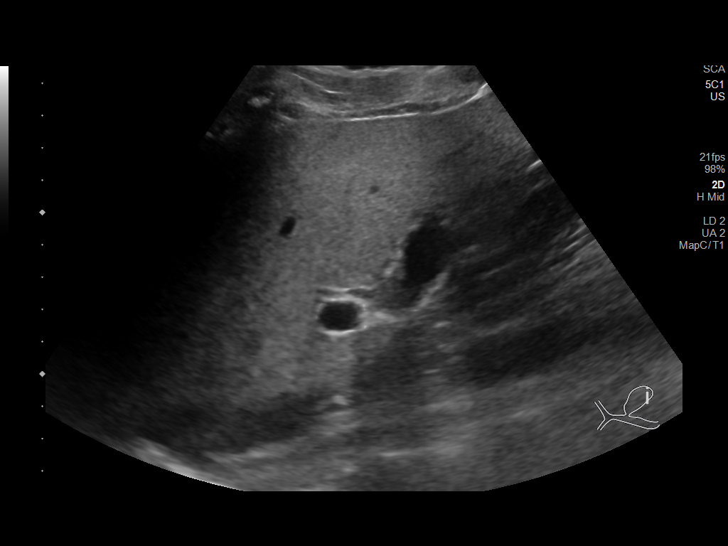
[im 7/25]
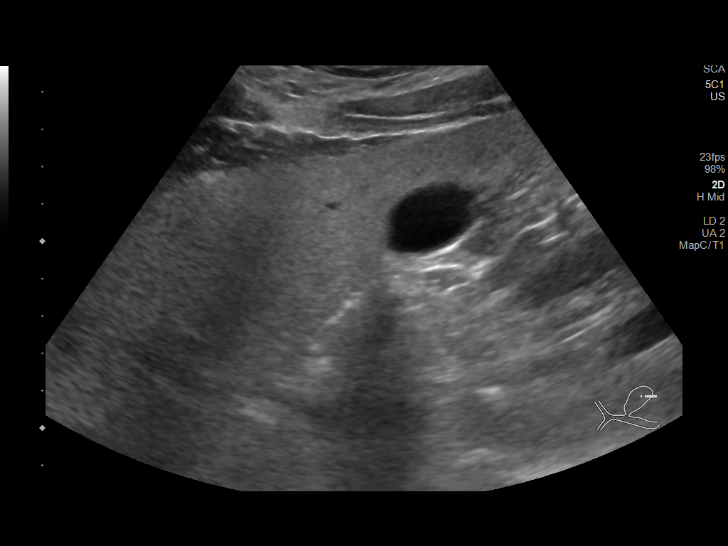
[im 9/25]
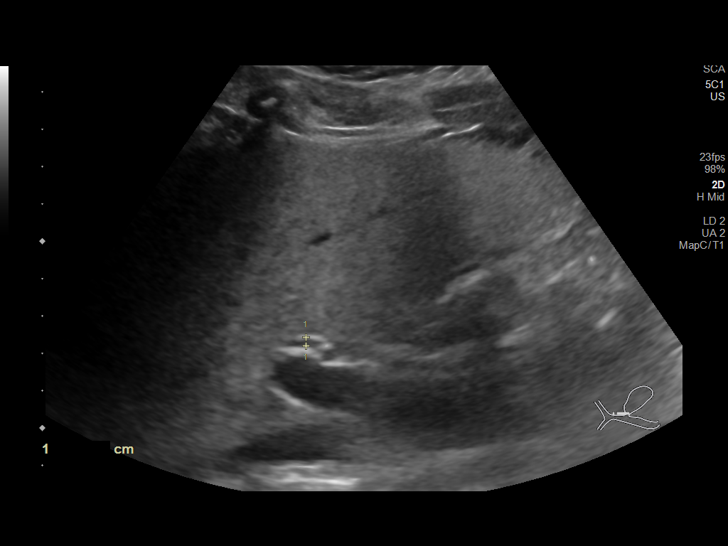
[im 10/25]
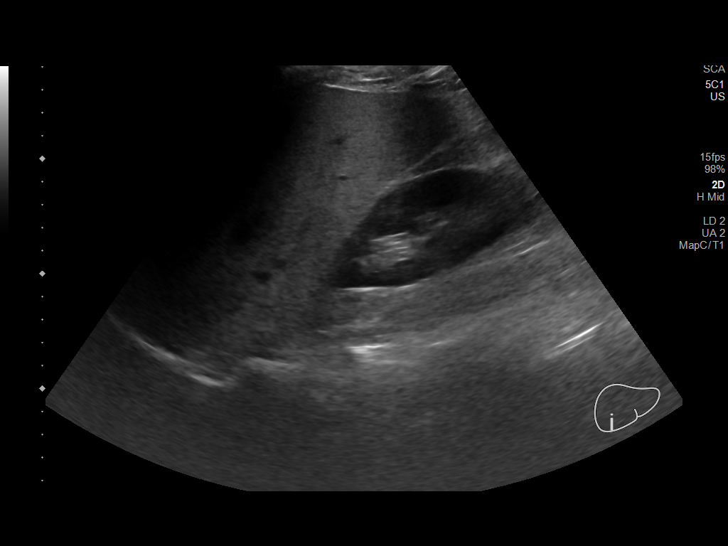
[im 12/25]
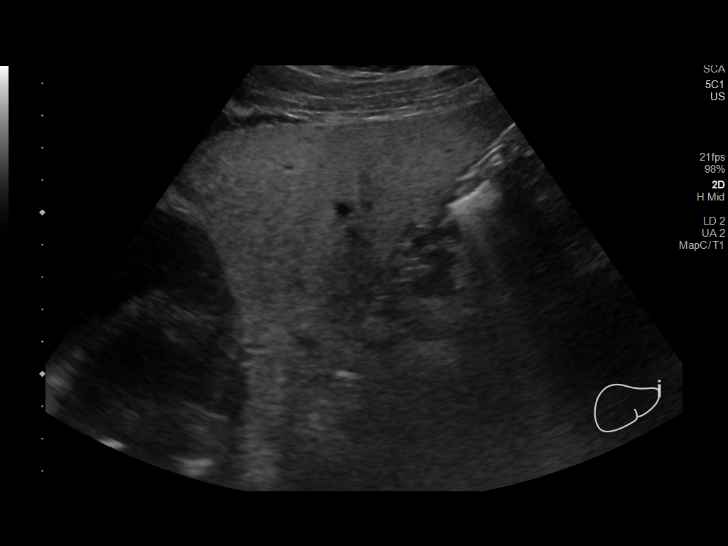
[im 14/25]
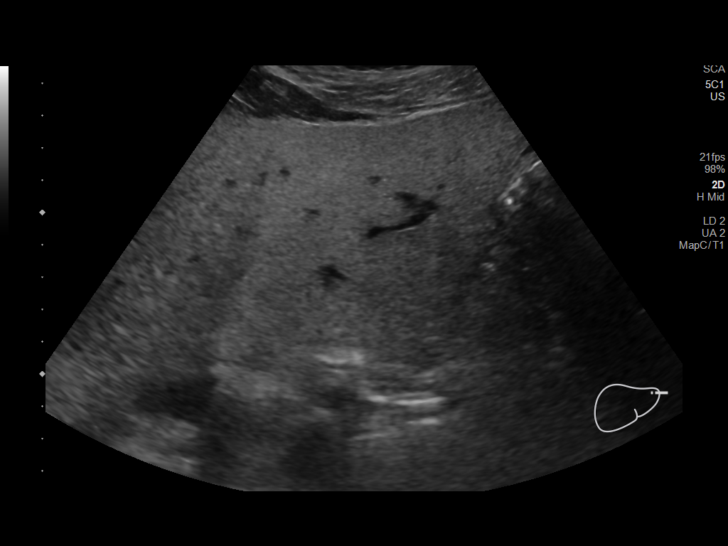
[im 16/25]
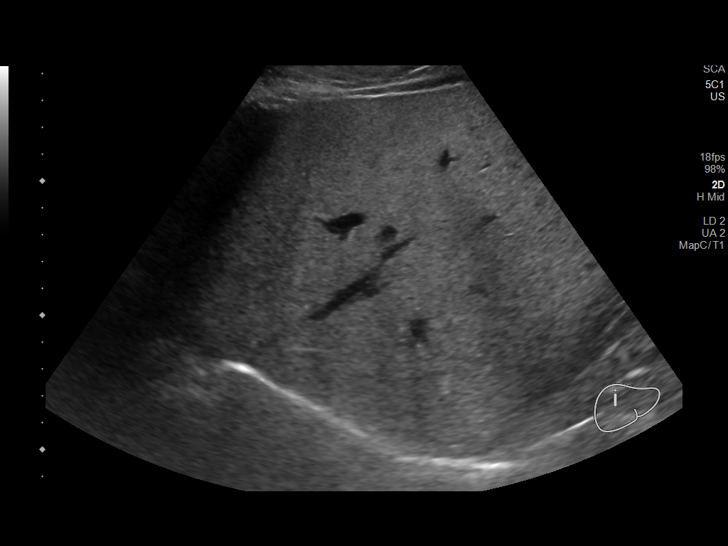
[im 17/25]
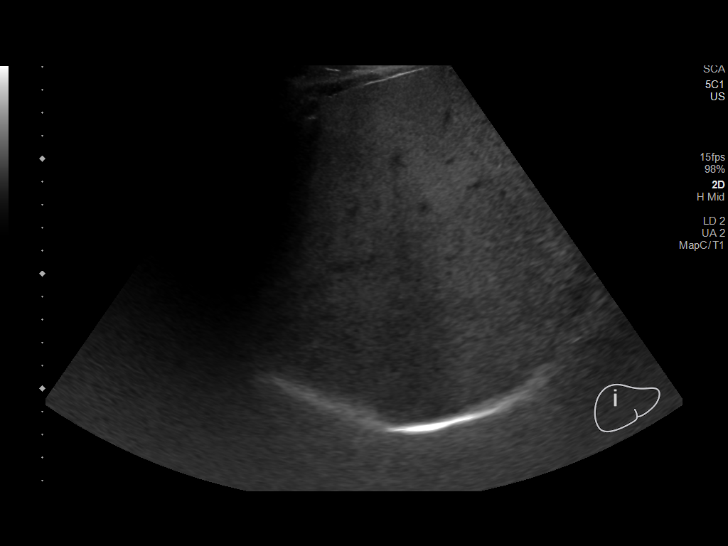
[im 19/25]
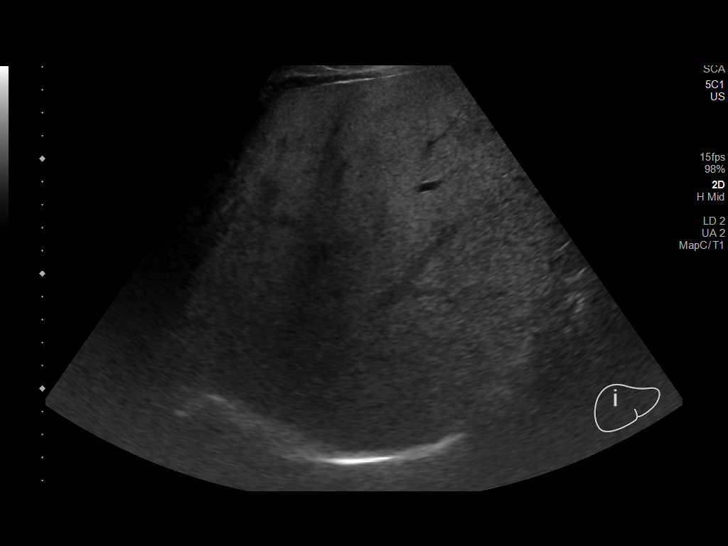
[im 21/25]
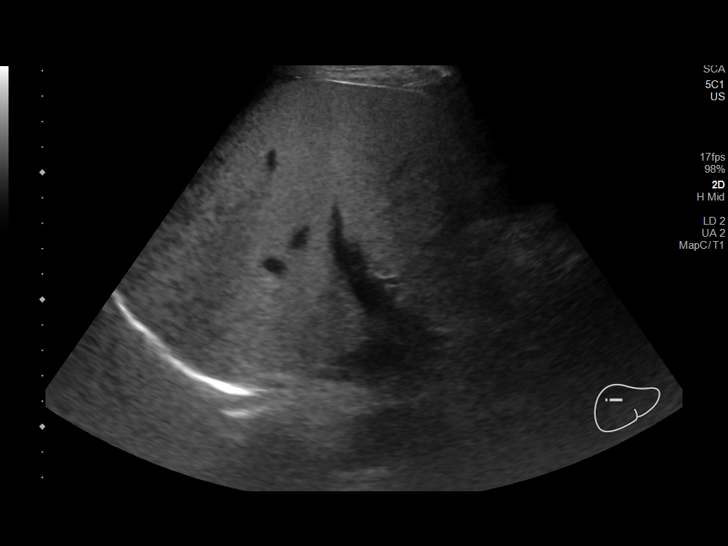
[im 23/25]
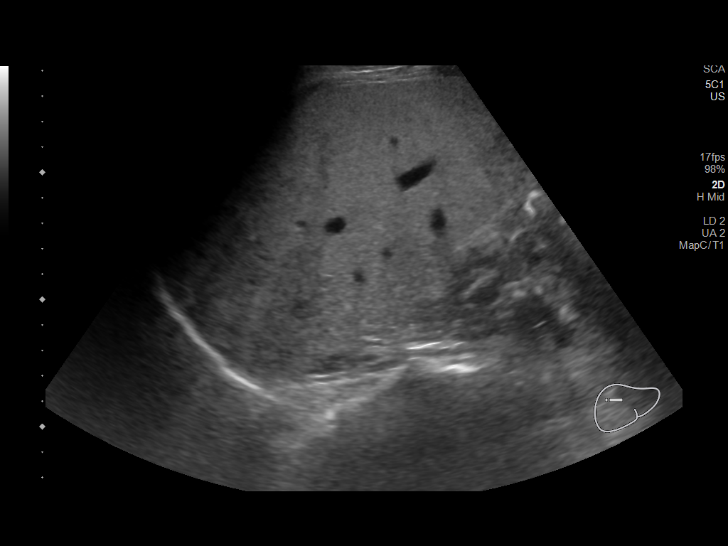
[im 25/25]
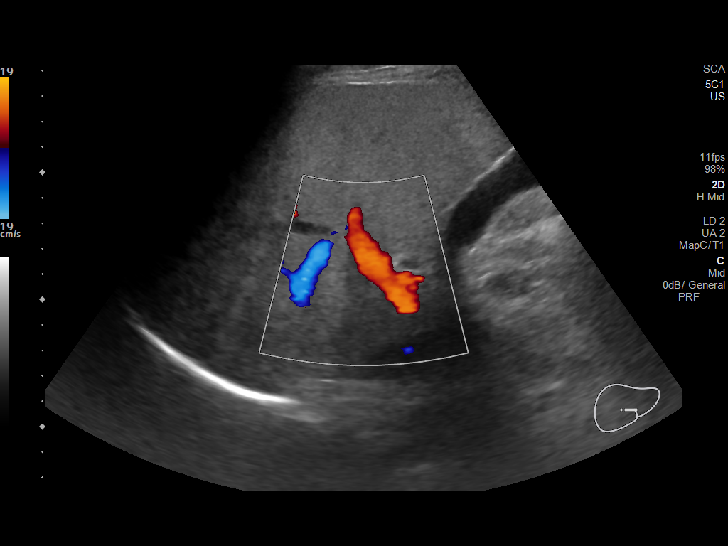

[14 of 25 positions shown; findings below may reference images not displayed]

FINDINGS: Gallbladder:

No gallstones or wall thickening visualized. No sonographic Murphy
sign noted by sonographer.

Common bile duct:

Diameter: 4 mm, within normal limits.

Liver:

No focal lesion identified. Increased hepatic parenchymal
echogenicity. Interrogated portal vein is patent on color Doppler
imaging with normal direction of blood flow towards the liver.
IMPRESSION: Increased hepatic parenchymal echogenicity. This is a nonspecific
finding which may be seen in the setting of hepatic steatosis or
other chronic hepatic parenchymal disease.

Otherwise unremarkable right upper quadrant ultrasound as above.

## 2022-04-14 ENCOUNTER — Ambulatory Visit: Payer: BC Managed Care – PPO | Admitting: Gastroenterology

## 2022-06-12 ENCOUNTER — Encounter: Payer: Self-pay | Admitting: Gastroenterology

## 2022-06-12 ENCOUNTER — Ambulatory Visit: Payer: BC Managed Care – PPO | Admitting: Gastroenterology

## 2022-06-12 VITALS — BP 130/88 | HR 80 | Ht 70.0 in | Wt 186.0 lb

## 2022-06-12 DIAGNOSIS — K219 Gastro-esophageal reflux disease without esophagitis: Secondary | ICD-10-CM | POA: Diagnosis not present

## 2022-06-12 DIAGNOSIS — Z1211 Encounter for screening for malignant neoplasm of colon: Secondary | ICD-10-CM | POA: Diagnosis not present

## 2022-06-12 DIAGNOSIS — R7989 Other specified abnormal findings of blood chemistry: Secondary | ICD-10-CM | POA: Diagnosis not present

## 2022-06-12 DIAGNOSIS — Z8619 Personal history of other infectious and parasitic diseases: Secondary | ICD-10-CM

## 2022-06-12 MED ORDER — NA SULFATE-K SULFATE-MG SULF 17.5-3.13-1.6 GM/177ML PO SOLN: 1.0000 | Freq: Once | ORAL | 0 refills | Status: AC

## 2022-06-12 NOTE — Patient Instructions (Addendum)
You have been scheduled for an endoscopy and colonoscopy. Please follow the written instructions given to you at your visit today. Please pick up your prep supplies at the pharmacy within the next 1-3 days. If you use inhalers (even only as needed), please bring them with you on the day of your procedure.   Due to recent changes in healthcare laws, you may see the results of your imaging and laboratory studies on MyChart before your provider has had a chance to review them.  We understand that in some cases there may be results that are confusing or concerning to you. Not all laboratory results come back in the same time frame and the provider may be waiting for multiple results in order to interpret others.  Please give [place] 48 hours in order for your provider to thoroughly review all the results before contacting the office for clarification of your results.    Thank you for choosing me and [facility].  Dr. Rush Landmark

## 2022-06-12 NOTE — Progress Notes (Signed)
[facility] VISIT   Primary Care Provider [address], [place] [address] [phone]  Patient Profile: Brendan Sharp is a 61 y.o. male with a pmh significant for hypertension, hyperlipidemia, fatty liver (imaging based), ?IBS.  The patient presents to the [facility] for an evaluation and management of problem(s) noted below:  Problem List 1. Gastroesophageal reflux disease, unspecified whether esophagitis present   2. History of Helicobacter pylori infection   3. Colon cancer screening   4. Abnormal LFTs     History of Present Illness Please see prior notes for full details of HPI.  Interval History The patient returns for follow-up.  His surveillance colonoscopy timing came up and he wanted an opportunity to be seen in clinic.  He has had new progressive GERD symptoms that have been occurring at this point in time.  He has not had issues with acid reflux in the past.  He has no dysphagia or odynophagia symptoms but this is a relatively new symptom for him.  He is taking medications as needed at this point.  He has a history of previous H. pylori infection in the past and was treated and had negative stool antigen testing thereafter but similar symptoms are occurring at this point.  Patient has never had an upper endoscopy.  He is not taking significant nonsteroidals or BC/Goody powders.  He previously had abnormal LFTs but they have been normalized on his last evaluation.  GI Review of Systems Positive as above Negative for nausea, vomiting, alteration of bowel habits, melena, hematochezia  Review of Systems General: Denies fevers/chills/weight loss unintentionally HEENT: Denies oral lesions/sore throat Cardiovascular: Denies chest pain Pulmonary: Denies shortness of breath/nocturnal cough Gastroenterological: See HPI Genitourinary: Denies darkened urine Hematological: Denies easy  bruising/bleeding Dermatological: Denies jaundice Psychological: Mood is stable   Medications Current Outpatient Medications  Medication Sig Dispense Refill   amLODipine (NORVASC) 2.5 MG tablet Take 1 tablet (2.5 mg total) by mouth daily. 90 tablet 3   atorvastatin (LIPITOR) 20 MG tablet Take 20 mg by mouth daily.     hydrOXYzine (ATARAX/VISTARIL) 10 MG tablet Take by mouth.     buPROPion (WELLBUTRIN SR) 100 MG 12 hr tablet Take 100 mg by mouth 2 (two) times daily. (Patient not taking: Reported on 06/12/2022)     olmesartan (BENICAR) 5 MG tablet Take 2 tablets (10 mg total) by mouth daily. 180 tablet 3   No current facility-administered medications for this visit.    Allergies No Known Allergies  Histories Past Medical History:  Diagnosis Date   Abnormal serum level of alkaline phosphatase 10/23/11   39   Arthritis    Chondromalacia of right patella    Esophageal reflux    no Endo to date   Hyperlipemia    Hypertension    IBS (irritable bowel syndrome)    Dr Sharlett Iles   Past Surgical History:  Procedure Laterality Date   CHONDROPLASTY Right 01/03/2015   Procedure: CHONDROPLASTY;  Surgeon: Kathryne Hitch, MD;  Location: [facility];  Service: Orthopedics;  Laterality: Right;   COLONOSCOPY  2013   for IBS, Dr Sharlett Iles   hydrocele      surgery @ age 4   KNEE ARTHROSCOPY WITH MEDIAL MENISECTOMY Right 01/03/2015   Procedure: RIGHT KNEE ARTHROSCOPY CHONDROPLASTY WITH MEDIAL MENISCECTOMY;  Surgeon: Kathryne Hitch, MD;  Location: [facility];  Service: Orthopedics;  Laterality: Right;   TONSILLECTOMY AND ADENOIDECTOMY     Social History  Socioeconomic History   Marital status: Married    Spouse name: Not on file   Number of children: 1   Years of education: MS   Highest education level: Not on file  Occupational History   Occupation: Higher education careers adviser: [employer]  Tobacco Use   Smoking status: Never   Smokeless tobacco: Never   Vaping Use   Vaping Use: Never used  Substance and Sexual Activity   Alcohol use: Yes    Comment: 2-3 /week   Drug use: No   Sexual activity: Not on file  Other Topics Concern   Not on file  Social History Narrative   Caffeine daily    He works as an Art gallery manager.   Lives with wife in a 2 story home.    Has one child.    Education: MSE   Social Determinants of Radio broadcast assistant Strain: Not on file  Food Insecurity: Not on file  Transportation Needs: Not on file  Physical Activity: Not on file  Stress: Not on file  Social Connections: Not on file  Intimate Partner Violence: Not on file   Family History  Problem Relation Age of Onset   Hyperlipidemia Mother    Hyperlipidemia Father    Hypertension Father    Heart attack Father    Colon cancer Neg Hx    Diabetes Neg Hx    Stroke Neg Hx    Heart disease Neg Hx    Cancer Neg Hx    Inflammatory bowel disease Neg Hx    Liver disease Neg Hx    Pancreatic cancer Neg Hx    Rectal cancer Neg Hx    Stomach cancer Neg Hx    I have reviewed his medical, social, and family history in detail and updated the electronic medical record as necessary.    PHYSICAL EXAMINATION  BP 130/88   Pulse 80   Ht '5\' 10"'$  (1.778 m)   Wt 186 lb (84.4 kg)   SpO2 98%   BMI 26.69 kg/m  Wt Readings from Last 3 Encounters:  06/12/22 186 lb (84.4 kg)  11/08/20 188 lb (85.3 kg)  03/15/20 176 lb 5.9 oz (80 kg)  GEN: NAD, appears stated age, doesn't appear chronically ill PSYCH: Cooperative, without pressured speech EYE: Conjunctivae pink, sclerae anicteric ENT: MMM CV: Nontachycardic RESP: No audible wheezing GI: NABS, soft, protuberant abdomen, NT, without rebound or guarding, no hepatosplenomegaly appreciated MSK/EXT: No lower extremity edema SKIN: No jaundice NEURO:  Alert & Oriented x 3, no focal deficits   REVIEW OF DATA  I reviewed the following data at the time of this encounter:  GI Procedures and Studies   2013 colonoscopy No polyps or cancers Otherwise normal exam 10-year follow-up recommended  Laboratory Studies  Reviewed those in epic  Imaging Studies  January 2021 abdominal ultrasound right upper quadrant IMPRESSION: Increased hepatic parenchymal echogenicity. This is a nonspecific finding which may be seen in the setting of hepatic steatosis or other chronic hepatic parenchymal disease. Otherwise unremarkable right upper quadrant ultrasound as above.   ASSESSMENT  Mr. Eveland is a 61 y.o. male with a pmh significant for hypertension, hyperlipidemia, fatty liver (imaging based), ?IBS.  The patient is seen today for evaluation and management of:  1. Gastroesophageal reflux disease, unspecified whether esophagitis present   2. History of Helicobacter pylori infection   3. Colon cancer screening   4. Abnormal LFTs    The patient is hemodynamically stable at  this time.  Clinically, he has had the abrupt onset of new onset heartburn symptoms without previous issues in the past.  Although he is not having any unintentional weight loss or dysphagia symptoms, the significant occurrence of this when it has not previously been an issue for him is somewhat concerning.  He will continue occasions on an as-needed basis but an upper endoscopy is recommended to evaluate ensure nothing else is being missed at this point in time with the abrupt onset of symptoms.  He also has a history of previous H. pylori infection and although he had eradication we will ensure that nothing else has occurred/recurred in regards to reonset of infection.  Patient is due for colon cancer screening.  We will plan to do both his upper and lower endoscopy together.  The risks and benefits of endoscopic evaluation were discussed with the patient; these include but are not limited to the risk of perforation, infection, bleeding, missed lesions, lack of diagnosis, severe illness requiring hospitalization, as well as anesthesia  and sedation related illnesses.  The patient and/or family is agreeable to proceed.  All patient questions were answered to the best of my ability, and the patient agrees to the aforementioned plan of action with follow-up as indicated.   PLAN  Continue OTC H2 RA versus PPI for GERD symptoms now GERD Lifestyle modifications discussed -Eat meals >3 hours before bedtime -Do not lay down or lie flat immediately after eating for at least 2-hours -Do not overeat (decrease portion size and/or eat more frequent smaller meals) -Eat slowly -Wear Loose-fitting clothes -Raise Head of Bed (Head & Chest > Feet with bed blocks) -Avoid foods that trigger the symptoms (Onions, Chocolate, Caffeine, Spicy, and Fatty) -Work on Losing [employer] -Stop Tobacco Use -Avoid Alcohol -Increase Exercise Activity -Maintain Heartburn Diary/Log Proceed with scheduling colonoscopy for screening Proceed with scheduling diagnostic endoscopy for new onset/abrupt GERD symptoms   Orders Placed This Encounter  Procedures   Ambulatory referral to Gastroenterology    New Prescriptions   No medications on file   Modified Medications   No medications on file    Planned Follow Up No follow-ups on file.   Total Time in Face-to-Face and in Coordination of Care for patient including independent/personal interpretation/review of prior testing, medical history, examination, medication adjustment, communicating results with the patient directly, and documentation within the EHR is 25 minutes.Justice Britain, MD [facility] Advanced Endoscopy Office # [phone]

## 2022-06-14 ENCOUNTER — Encounter: Payer: Self-pay | Admitting: Gastroenterology

## 2022-06-26 ENCOUNTER — Encounter: Payer: Self-pay | Admitting: Gastroenterology

## 2022-08-12 ENCOUNTER — Encounter: Payer: BC Managed Care – PPO | Admitting: Gastroenterology

## 2022-08-13 ENCOUNTER — Ambulatory Visit (AMBULATORY_SURGERY_CENTER): Payer: BC Managed Care – PPO

## 2022-08-13 VITALS — Ht 70.0 in | Wt 184.0 lb

## 2022-08-13 DIAGNOSIS — Z1211 Encounter for screening for malignant neoplasm of colon: Secondary | ICD-10-CM

## 2022-08-13 DIAGNOSIS — K219 Gastro-esophageal reflux disease without esophagitis: Secondary | ICD-10-CM

## 2022-08-13 NOTE — Progress Notes (Signed)

## 2022-09-02 ENCOUNTER — Ambulatory Visit (AMBULATORY_SURGERY_CENTER): Payer: BC Managed Care – PPO | Admitting: Gastroenterology

## 2022-09-02 ENCOUNTER — Encounter: Payer: Self-pay | Admitting: Gastroenterology

## 2022-09-02 VITALS — BP 118/73 | HR 69 | Temp 98.0°F | Resp 16 | Ht 70.0 in | Wt 184.0 lb

## 2022-09-02 DIAGNOSIS — K219 Gastro-esophageal reflux disease without esophagitis: Secondary | ICD-10-CM

## 2022-09-02 DIAGNOSIS — D122 Benign neoplasm of ascending colon: Secondary | ICD-10-CM | POA: Diagnosis not present

## 2022-09-02 DIAGNOSIS — D123 Benign neoplasm of transverse colon: Secondary | ICD-10-CM

## 2022-09-02 DIAGNOSIS — D124 Benign neoplasm of descending colon: Secondary | ICD-10-CM

## 2022-09-02 DIAGNOSIS — Z1211 Encounter for screening for malignant neoplasm of colon: Secondary | ICD-10-CM | POA: Diagnosis present

## 2022-09-02 MED ORDER — SODIUM CHLORIDE 0.9 % IV SOLN: 500.0000 mL | Freq: Once | INTRAVENOUS | Status: DC

## 2022-09-02 MED ORDER — OMEPRAZOLE 40 MG PO CPDR: 40.0000 mg | DELAYED_RELEASE_CAPSULE | Freq: Every day | ORAL | 3 refills | Status: AC

## 2022-09-02 NOTE — Patient Instructions (Signed)
Thank you for letting us take care of your healthcare needs today. Please see handouts given to you on Polyps, Diverticulosis, Hemorrhoids, Gastritis and Hiatal Hernia.    YOU HAD AN ENDOSCOPIC PROCEDURE TODAY AT [facility]:   Refer to the procedure report that was given to you for any specific questions about what was found during the examination.  If the procedure report does not answer your questions, please call your gastroenterologist to clarify.  If you requested that your care partner not be given the details of your procedure findings, then the procedure report has been included in a sealed envelope for you to review at your convenience later.  YOU SHOULD EXPECT: Some feelings of bloating in the abdomen. Passage of more gas than usual.  Walking can help get rid of the air that was put into your GI tract during the procedure and reduce the bloating. If you had a lower endoscopy (such as a colonoscopy or flexible sigmoidoscopy) you may notice spotting of blood in your stool or on the toilet paper. If you underwent a bowel prep for your procedure, you may not have a normal bowel movement for a few days.  Please Note:  You might notice some irritation and congestion in your nose or some drainage.  This is from the oxygen used during your procedure.  There is no need for concern and it should clear up in a day or so.  SYMPTOMS TO REPORT IMMEDIATELY:  Following lower endoscopy (colonoscopy or flexible sigmoidoscopy):  Excessive amounts of blood in the stool  Significant tenderness or worsening of abdominal pains  Swelling of the abdomen that is new, acute  Fever of 100F or higher  Following upper endoscopy (EGD)  Vomiting of blood or coffee ground material  New chest pain or pain under the shoulder blades  Painful or persistently difficult swallowing  New shortness of breath  Fever of 100F or higher  Black, tarry-looking stools  For urgent or emergent issues, a  gastroenterologist can be reached at any hour by calling [phone]. Do not use MyChart messaging for urgent concerns.    DIET:  We do recommend a small meal at first, but then you may proceed to your regular diet.  Drink plenty of fluids but you should avoid alcoholic beverages for 24 hours.  ACTIVITY:  You should plan to take it easy for the rest of today and you should NOT DRIVE or use heavy machinery until tomorrow (because of the sedation medicines used during the test).    FOLLOW UP: Our staff will call the number listed on your records the next business day following your procedure.  We will call around 7:15- 8:00 am to check on you and address any questions or concerns that you may have regarding the information given to you following your procedure. If we do not reach you, we will leave a message.     If any biopsies were taken you will be contacted by phone or by letter within the next 1-3 weeks.  Please call us at [phone] if you have not heard about the biopsies in 3 weeks.    SIGNATURES/CONFIDENTIALITY: You and/or your care partner have signed paperwork which will be entered into your electronic medical record.  These signatures attest to the fact that that the information above on your After Visit Summary has been reviewed and is understood.  Full responsibility of the confidentiality of this discharge information lies with you and/or your care-partner.

## 2022-09-02 NOTE — Progress Notes (Signed)
To pacu, VSS. Report to [email or web address] 

## 2022-09-02 NOTE — Progress Notes (Signed)
Called to room to assist during endoscopic procedure.  Patient ID and intended procedure confirmed with present staff. Received instructions for my participation in the procedure from the performing physician.

## 2022-09-02 NOTE — Progress Notes (Signed)
VS by DT  Pt's states no medical or surgical changes since previsit or office visit.  

## 2022-09-02 NOTE — Progress Notes (Signed)
GASTROENTEROLOGY PROCEDURE H&P NOTE   Primary Care Physician: Gates Rigg, MD  HPI: Brendan Sharp is a 62 y.o. male who presents for EGD/Colonoscopy for evaluation of new-onset GERD and for colon cancer screening.  Past Medical History:  Diagnosis Date   Abnormal serum level of alkaline phosphatase 10/23/2011   39   Arthritis    Chondromalacia of right patella    Esophageal reflux    no Endo to date   Hyperlipemia    Hypertension    IBS (irritable bowel syndrome)    Dr Sharlett Iles   Sleep apnea    Past Surgical History:  Procedure Laterality Date   CHONDROPLASTY Right 01/03/2015   Procedure: CHONDROPLASTY;  Surgeon: Kathryne Hitch, MD;  Location: [facility];  Service: Orthopedics;  Laterality: Right;   COLONOSCOPY  2013   for IBS, Dr Sharlett Iles   hydrocele      surgery @ age 89   KNEE ARTHROSCOPY WITH MEDIAL MENISECTOMY Right 01/03/2015   Procedure: RIGHT KNEE ARTHROSCOPY CHONDROPLASTY WITH MEDIAL MENISCECTOMY;  Surgeon: Kathryne Hitch, MD;  Location: [facility];  Service: Orthopedics;  Laterality: Right;   TONSILLECTOMY AND ADENOIDECTOMY     Current Outpatient Medications  Medication Sig Dispense Refill   amLODipine (NORVASC) 2.5 MG tablet Take 1 tablet (2.5 mg total) by mouth daily. 90 tablet 3   atorvastatin (LIPITOR) 20 MG tablet Take 20 mg by mouth daily.     famotidine (PEPCID) 40 MG tablet Take by mouth.     hydrOXYzine (ATARAX/VISTARIL) 10 MG tablet Take by mouth.     Misc. Devices MISC TRAZODONE 75 mg (Rx from [place] 10/18/20): 1/3-2/3 tabs QHS prn insomnia     No current facility-administered medications for this visit.    Current Outpatient Medications:    amLODipine (NORVASC) 2.5 MG tablet, Take 1 tablet (2.5 mg total) by mouth daily., Disp: 90 tablet, Rfl: 3   atorvastatin (LIPITOR) 20 MG tablet, Take 20 mg by mouth daily., Disp: , Rfl:    famotidine (PEPCID) 40 MG tablet, Take by mouth., Disp: , Rfl:    hydrOXYzine  (ATARAX/VISTARIL) 10 MG tablet, Take by mouth., Disp: , Rfl:    Misc. Devices MISC, TRAZODONE 75 mg (Rx from [place] 10/18/20): 1/3-2/3 tabs QHS prn insomnia, Disp: , Rfl:  No Known Allergies Family History  Problem Relation Age of Onset   Hyperlipidemia Mother    Hyperlipidemia Father    Hypertension Father    Heart attack Father    Colon cancer Neg Hx    Diabetes Neg Hx    Stroke Neg Hx    Heart disease Neg Hx    Cancer Neg Hx    Inflammatory bowel disease Neg Hx    Liver disease Neg Hx    Pancreatic cancer Neg Hx    Rectal cancer Neg Hx    Stomach cancer Neg Hx    Colon polyps Neg Hx    Esophageal cancer Neg Hx    Social History   Socioeconomic History   Marital status: Married    Spouse name: Not on file   Number of children: 1   Years of education: MS   Highest education level: Not on file  Occupational History   Occupation: Higher education careers adviser: [employer]  Tobacco Use   Smoking status: Never   Smokeless tobacco: Never  Vaping Use   Vaping Use: Never used  Substance and Sexual Activity   Alcohol use: Yes  Comment: 2-3 /week   Drug use: No   Sexual activity: Not on file  Other Topics Concern   Not on file  Social History Narrative   Caffeine daily    He works as an Art gallery manager.   Lives with wife in a 2 story home.    Has one child.    Education: MSE   Social Determinants of Radio broadcast assistant Strain: Not on file  Food Insecurity: Not on file  Transportation Needs: Not on file  Physical Activity: Not on file  Stress: Not on file  Social Connections: Not on file  Intimate Partner Violence: Not on file    Physical Exam: There were no vitals filed for this visit. There is no height or weight on file to calculate BMI. GEN: NAD EYE: Sclerae anicteric ENT: MMM CV: Non-tachycardic GI: Soft, NT/ND NEURO:  Alert & Oriented x 3  Lab Results: No results for input(s): "WBC", "HGB", "HCT", "PLT" in the last 72  hours. BMET No results for input(s): "NA", "K", "CL", "CO2", "GLUCOSE", "BUN", "CREATININE", "CALCIUM" in the last 72 hours. LFT No results for input(s): "PROT", "ALBUMIN", "AST", "ALT", "ALKPHOS", "BILITOT", "BILIDIR", "IBILI" in the last 72 hours. PT/INR No results for input(s): "LABPROT", "INR" in the last 72 hours.   Impression / Plan: This is a 62 y.o.male who presents for EGD/Colonoscopy for evaluation of new-onset GERD and for colon cancer screening.  The risks and benefits of endoscopic evaluation/treatment were discussed with the patient and/or family; these include but are not limited to the risk of perforation, infection, bleeding, missed lesions, lack of diagnosis, severe illness requiring hospitalization, as well as anesthesia and sedation related illnesses.  The patient's history has been reviewed, patient examined, no change in status, and deemed stable for procedure.  The patient and/or family is agreeable to proceed.    Justice Britain, MD [facility] Office # [phone]

## 2022-09-02 NOTE — Op Note (Signed)
[facility] Patient Name: Brendan Sharp Procedure Date: 09/02/2022 11:30 AM MRN: [id number] Endoscopist: Justice Britain , MD, [id number] Age: 62 Referring MD:  Date of Birth: [birth year] Gender: Male Account #: [id number] Procedure:                Colonoscopy Indications:              Screening for colorectal malignant neoplasm Medicines:                Monitored Anesthesia Care Procedure:                Pre-Anesthesia Assessment:                           - Prior to the procedure, a History and Physical                            was performed, and patient medications and                            allergies were reviewed. The patient's tolerance of                            previous anesthesia was also reviewed. The risks                            and benefits of the procedure and the sedation                            options and risks were discussed with the patient.                            All questions were answered, and informed consent                            was obtained. Prior Anticoagulants: The patient has                            taken no anticoagulant or antiplatelet agents. ASA                            Grade Assessment: II - A patient with mild systemic                            disease. After reviewing the risks and benefits,                            the patient was deemed in satisfactory condition to                            undergo the procedure.                           After obtaining informed consent, the colonoscope  was passed under direct vision. Throughout the                            procedure, the patient's blood pressure, pulse, and                            oxygen saturations were monitored continuously. The                            Olympus [id number]) Colonoscope was                            introduced through the anus and advanced to the 3                            cm into the  ileum. The colonoscopy was performed                            with moderate difficulty due to restricted mobility                            of the colon and a tortuous colon. Successful                            completion of the procedure was aided by changing                            the patient's position, withdrawing and reinserting                            the scope, straightening and shortening the scope                            to obtain bowel loop reduction and using scope                            torsion. The patient tolerated the procedure. The                            quality of the bowel preparation was good. The                            terminal ileum, ileocecal valve, appendiceal                            orifice, and rectum were photographed. Scope In: 11:40:39 AM Scope Out: 11:57:03 AM Scope Withdrawal Time: 0 hours 9 minutes 29 seconds  Total Procedure Duration: 0 hours 16 minutes 24 seconds  Findings:                 The digital rectal exam findings include                            hemorrhoids. Pertinent negatives include no  palpable rectal lesions.                           Three sessile polyps were found in the descending                            colon, transverse colon and ascending colon. The                            polyps were 3 to 5 mm in size. These polyps were                            removed with a cold snare. Resection and retrieval                            were complete.                           The left colon was significantly tortuous.                           Many small-mouthed diverticula were found in the                            recto-sigmoid colon and sigmoid colon. This is in                            the area of significant tortuosity noted above.                           Normal mucosa was found in the entire colon                            otherwise.                           Non-bleeding  non-thrombosed external and internal                            hemorrhoids were found during retroflexion, during                            perianal exam and during digital exam. The                            hemorrhoids were Grade II (internal hemorrhoids                            that prolapse but reduce spontaneously). Complications:            No immediate complications. Estimated Blood Loss:     Estimated blood loss was minimal. Impression:               - Hemorrhoids found on digital rectal exam.                           -  Tortuous colon.                           - Three 3 to 5 mm polyps in the descending colon,                            in the transverse colon and in the ascending colon,                            removed with a cold snare. Resected and retrieved.                           - Diverticulosis in the recto-sigmoid colon and in                            the sigmoid colon.                           - Normal mucosa in the entire examined colon.                           - Non-bleeding non-thrombosed external and internal                            hemorrhoids. Recommendation:           - The patient will be observed post-procedure,                            until all discharge criteria are met.                           - Discharge patient to home.                           - Patient has a contact number available for                            emergencies. The signs and symptoms of potential                            delayed complications were discussed with the                            patient. Return to normal activities tomorrow.                            Written discharge instructions were provided to the                            patient.                           - High fiber diet.                           -  Use FiberCon 1-2 tablets PO daily.                           - Continue present medications.                           - Await pathology  results.                           - Repeat colonoscopy in 3/12/21/08 years for                            surveillance based on pathology results and                            findings of adenomatous tissue.                           - The findings and recommendations were discussed                            with the patient.                           - The findings and recommendations were discussed                            with the patient's family. Justice Britain, MD 09/02/2022 12:06:38 PM

## 2022-09-02 NOTE — Op Note (Signed)
[facility] Patient Name: Brendan Sharp Procedure Date: 09/02/2022 11:31 AM MRN: [id number] Endoscopist: Justice Britain , MD, [id number] Age: 62 Referring MD:  Date of Birth: 06-18-1961 Gender: Male Account #: [id number] Procedure:                Upper GI endoscopy Indications:              Heartburn Medicines:                Monitored Anesthesia Care Procedure:                Pre-Anesthesia Assessment:                           - Prior to the procedure, a History and Physical                            was performed, and patient medications and                            allergies were reviewed. The patient's tolerance of                            previous anesthesia was also reviewed. The risks                            and benefits of the procedure and the sedation                            options and risks were discussed with the patient.                            All questions were answered, and informed consent                            was obtained. Prior Anticoagulants: The patient has                            taken no anticoagulant or antiplatelet agents. ASA                            Grade Assessment: II - A patient with mild systemic                            disease. After reviewing the risks and benefits,                            the patient was deemed in satisfactory condition to                            undergo the procedure.                           After obtaining informed consent, the endoscope was  passed under direct vision. Throughout the                            procedure, the patient's blood pressure, pulse, and                            oxygen saturations were monitored continuously. The                            Olympus Scope [id number] was introduced through the                            mouth, and advanced to the second part of duodenum.                            The upper GI endoscopy was  accomplished without                            difficulty. The patient tolerated the procedure. Scope In: Scope Out: Findings:                 No gross lesions were noted in the entire esophagus.                           The Z-line was irregular and was found 40 cm from                            the incisors.                           A 1 cm hiatal hernia was present.                           Patchy moderate inflammation characterized by                            erosions, erythema and granularity was found in the                            entire examined stomach. Biopsies were taken with a                            cold forceps for histology and Helicobacter pylori                            testing.                           No gross lesions were noted in the duodenal bulb,                            in the first portion of the duodenum and in the  second portion of the duodenum. Complications:            No immediate complications. Estimated Blood Loss:     Estimated blood loss was minimal. Impression:               - No gross lesions in the entire esophagus. Z-line                            irregular, 40 cm from the incisors.                           - 1 cm hiatal hernia.                           - Gastritis. Biopsied.                           - No gross lesions in the duodenal bulb, in the                            first portion of the duodenum and in the second                            portion of the duodenum. Recommendation:           - Proceed to scheduled colonoscopy.                           - Initiate Omeprazole 40 mg daily.                           - Await pathology results.                           - Observe patient's clinical course.                           - The findings and recommendations were discussed                            with the patient.                           - The findings and recommendations were discussed                             with the patient's family. Justice Britain, MD 09/02/2022 12:02:16 PM

## 2022-09-03 ENCOUNTER — Telehealth: Payer: Self-pay

## 2022-09-03 NOTE — Telephone Encounter (Signed)
  Follow up Call-     09/02/2022   10:28 AM  Call back number  Post procedure Call Back phone  # [phone]  Permission to leave phone message Yes     Patient questions:  Do you have a fever, pain , or abdominal swelling? No. Pain Score  0 *  Have you tolerated food without any problems? Yes.    Have you been able to return to your normal activities? Yes.    Do you have any questions about your discharge instructions: Diet   No. Medications  No. Follow up visit  No.  Do you have questions or concerns about your Care? No.  Actions: * If pain score is 4 or above: No action needed, pain <4.

## 2022-09-05 ENCOUNTER — Encounter: Payer: Self-pay | Admitting: Gastroenterology

## 2023-05-12 ENCOUNTER — Emergency Department (HOSPITAL_BASED_OUTPATIENT_CLINIC_OR_DEPARTMENT_OTHER): Payer: BC Managed Care – PPO

## 2023-05-12 ENCOUNTER — Encounter (HOSPITAL_BASED_OUTPATIENT_CLINIC_OR_DEPARTMENT_OTHER): Payer: Self-pay

## 2023-05-12 ENCOUNTER — Other Ambulatory Visit: Payer: Self-pay

## 2023-05-12 ENCOUNTER — Emergency Department (HOSPITAL_BASED_OUTPATIENT_CLINIC_OR_DEPARTMENT_OTHER)
Admission: EM | Admit: 2023-05-12 | Discharge: 2023-05-12 | Disposition: A | Discharge status: Home / Self Care | Payer: BC Managed Care – PPO | Attending: Emergency Medicine | Admitting: Emergency Medicine

## 2023-05-12 DIAGNOSIS — Z79899 Other long term (current) drug therapy: Secondary | ICD-10-CM | POA: Diagnosis not present

## 2023-05-12 DIAGNOSIS — R109 Unspecified abdominal pain: Secondary | ICD-10-CM

## 2023-05-12 DIAGNOSIS — I10 Essential (primary) hypertension: Secondary | ICD-10-CM | POA: Insufficient documentation

## 2023-05-12 DIAGNOSIS — R1011 Right upper quadrant pain: Secondary | ICD-10-CM | POA: Insufficient documentation

## 2023-05-12 DIAGNOSIS — R1031 Right lower quadrant pain: Secondary | ICD-10-CM | POA: Diagnosis not present

## 2023-05-12 LAB — COMPREHENSIVE METABOLIC PANEL
ALT: 38 U/L (ref 0–44)
AST: 35 U/L (ref 15–41)
Albumin: 4.7 g/dL (ref 3.5–5.0)
Alkaline Phosphatase: 44 U/L (ref 38–126)
Anion gap: 8 (ref 5–15)
BUN: 18 mg/dL (ref 6–20)
CO2: 25 mmol/L (ref 22–32)
Calcium: 9 mg/dL (ref 8.9–10.3)
Chloride: 107 mmol/L (ref 98–111)
Creatinine, Ser: 0.96 mg/dL (ref 0.61–1.24)
GFR, Estimated: >60 mL/min (ref >60)
Glucose, Bld: 99 mg/dL (ref 70–99)
Potassium: 3.5 mmol/L (ref 3.5–5.1)
Sodium: 140 mmol/L (ref 135–145)
Total Bilirubin: 0.8 mg/dL (ref 0.3–1.2)
Total Protein: 7.6 g/dL (ref 6.5–8.1)

## 2023-05-12 LAB — URINALYSIS, ROUTINE W REFLEX MICROSCOPIC
Bilirubin Urine: NEGATIVE
Glucose, UA: NEGATIVE mg/dL
Hgb urine dipstick: NEGATIVE
Ketones, ur: 40 mg/dL — AB
Leukocytes,Ua: NEGATIVE
Nitrite: NEGATIVE
Protein, ur: NEGATIVE mg/dL
Specific Gravity, Urine: >=1.03 (ref 1.005–1.030)
pH: 6 (ref 5.0–8.0)

## 2023-05-12 LAB — CBC
HCT: 42.7 % (ref 39.0–52.0)
Hemoglobin: 14.7 g/dL (ref 13.0–17.0)
MCH: 33 pg (ref 26.0–34.0)
MCHC: 34.4 g/dL (ref 30.0–36.0)
MCV: 96 fL (ref 80.0–100.0)
Platelets: 223 10*3/uL (ref 150–400)
RBC: 4.45 MIL/uL (ref 4.22–5.81)
RDW: 11.9 % (ref 11.5–15.5)
WBC: 6.6 10*3/uL (ref 4.0–10.5)
nRBC: 0 % (ref 0.0–0.2)

## 2023-05-12 LAB — LIPASE, BLOOD: Lipase: 31 U/L (ref 11–51)

## 2023-05-12 MED ORDER — METHOCARBAMOL 500 MG PO TABS: 500.0000 mg | ORAL_TABLET | Freq: Two times a day (BID) | ORAL | 0 refills | Status: AC

## 2023-05-12 MED ORDER — IOHEXOL 350 MG/ML SOLN
100.0000 mL | Freq: Once | INTRAVENOUS | Status: AC | PRN
  Administered 2023-05-12: 100 mL via INTRAVENOUS

## 2023-05-12 MED ORDER — LIDOCAINE 5 % EX PTCH: 1.0000 | MEDICATED_PATCH | CUTANEOUS | 0 refills | Status: AC

## 2023-05-12 NOTE — ED Provider Notes (Signed)
[facility] Provider Note   CSN: [id number] Arrival date & time: 05/12/23  1707     History  Chief Complaint  Patient presents with   Abdominal Pain    Brendan Sharp is a 62 y.o. male history of IBS, GERD, hypertension here for evaluation of right side pain.  States this started 1 week ago.  No specific enticing factors.  No fever, nausea, vomiting, chest pain, shortness of breath, diarrhea, not worse with movement.  He has been able to eat and drink without difficulty, pain is not worse with eating and drinking.  Describes the pain as sharp, aching.  He has never had anything like this previously.  No meds PTA.  No history of kidney stones.  No dysuria hematuria.  No history of PE.  No pain or swelling to lower extremities.  HPI     Home Medications Prior to Admission medications   Medication Sig Start Date End Date Taking? Authorizing Provider  lidocaine (LIDODERM) 5 % Place 1 patch onto the skin daily. Remove & Discard patch within 12 hours or as directed by MD 05/12/23  Yes Willowdean Luhmann A, PA-C  methocarbamol (ROBAXIN) 500 MG tablet Take 1 tablet (500 mg total) by mouth 2 (two) times daily. 05/12/23  Yes Ereka Brau A, PA-C  amLODipine (NORVASC) 2.5 MG tablet Take 1 tablet (2.5 mg total) by mouth daily. 12/25/19   Copland, Gwenlyn Found, MD  atorvastatin (LIPITOR) 20 MG tablet Take 20 mg by mouth daily. 10/23/20   [provider]  famotidine (PEPCID) 40 MG tablet Take 40 mg by mouth daily as needed for heartburn. 08/01/21   [provider]  hydrOXYzine (ATARAX/VISTARIL) 10 MG tablet Take by mouth. 10/01/20   [provider]  Misc. Devices MISC TRAZODONE 75 mg (Rx from [place] 10/18/20): 1/3-2/3 tabs QHS prn insomnia 10/26/20   [provider]  omeprazole (PRILOSEC) 40 MG capsule Take 1 capsule (40 mg total) by mouth daily. 09/02/22   Mansouraty, Netty Starring., MD      Allergies    Patient has no  known allergies.    Review of Systems   Review of Systems  Constitutional: Negative.   HENT: Negative.    Respiratory: Negative.    Cardiovascular: Negative.   Gastrointestinal:  Positive for abdominal pain. Negative for abdominal distention, anal bleeding, blood in stool, constipation, diarrhea, nausea, rectal pain and vomiting.  Genitourinary: Negative.   Musculoskeletal: Negative.   Skin: Negative.   Neurological: Negative.   All other systems reviewed and are negative.   Physical Exam Updated Vital Signs BP (!) 136/91   Pulse 64   Temp 98.2 F (36.8 C) (Oral)   Resp 19   Wt 83.5 kg   SpO2 97%   BMI 26.40 kg/m  Physical Exam Vitals and nursing note reviewed.  Constitutional:      General: He is not in acute distress.    Appearance: He is well-developed. He is not ill-appearing, toxic-appearing or diaphoretic.  HENT:     Head: Atraumatic.  Eyes:     Pupils: Pupils are equal, round, and reactive to light.  Cardiovascular:     Rate and Rhythm: Normal rate and regular rhythm.     Heart sounds: Normal heart sounds.  Pulmonary:     Effort: Pulmonary effort is normal. No respiratory distress.     Breath sounds: Normal breath sounds.     Comments: Clear bilaterally, speaks in full sentences without difficulty Abdominal:  General: Bowel sounds are normal. There is no distension.     Palpations: Abdomen is soft.     Tenderness: There is abdominal tenderness in the right upper quadrant and right lower quadrant. There is no right CVA tenderness, left CVA tenderness, guarding or rebound. Negative signs include Murphy's sign, Rovsing's sign, McBurney's sign and obturator sign.     Hernia: No hernia is present.       Comments: No vesicles  Musculoskeletal:        General: Normal range of motion.     Cervical back: Normal range of motion and neck supple.       Back:  Skin:    General: Skin is warm and dry.     Comments: No rashes, lesions  Neurological:     General:  No focal deficit present.     Mental Status: He is alert and oriented to person, place, and time.     Cranial Nerves: Cranial nerves 2-12 are intact.     Sensory: Sensation is intact.     Motor: Motor function is intact.     Gait: Gait is intact.     Comments: Moves all 4 extremities Ambulatory    ED Results / Procedures / Treatments   Labs (all labs ordered are listed, but only abnormal results are displayed) Labs Reviewed  URINALYSIS, ROUTINE W REFLEX MICROSCOPIC - Abnormal; Notable for the following components:      Result Value   Ketones, ur 40 (*)    All other components within normal limits  LIPASE, BLOOD  COMPREHENSIVE METABOLIC PANEL  CBC    EKG None  Radiology CT Angio Chest PE W and/or Wo Contrast  Result Date: 05/12/2023 CLINICAL DATA:  Abdominal and flank pain, stone suspected. Diffuse right lower chest and upper right abdomen pain. PE suspected. EXAM: CT ANGIOGRAPHY CHEST CT ABDOMEN AND PELVIS WITH CONTRAST TECHNIQUE: Multidetector CT imaging of the chest was performed using the standard protocol during bolus administration of intravenous contrast. Multiplanar CT image reconstructions and MIPs were obtained to evaluate the vascular anatomy. Multidetector CT imaging of the abdomen and pelvis was performed using the standard protocol during bolus administration of intravenous contrast. RADIATION DOSE REDUCTION: This exam was performed according to the departmental dose-optimization program which includes automated exposure control, adjustment of the mA and/or kV according to patient size and/or use of iterative reconstruction technique. CONTRAST:  OMNIPAQUE IOHEXOL 350 MG/ML SOLN COMPARISON:  None Available. FINDINGS: CTA CHEST FINDINGS Cardiovascular: Negative for acute pulmonary embolism. Coronary artery atherosclerotic calcification. No pericardial effusion. No aortic aneurysm. Mediastinum/Nodes: Trachea and esophagus are unremarkable. No thoracic adenopathy.  Lungs/Pleura: Lungs are clear. No pleural effusion or pneumothorax. Musculoskeletal: No acute abnormality. Review of the MIP images confirms the above findings. CT ABDOMEN and PELVIS FINDINGS Hepatobiliary: Hepatic steatosis. Unremarkable gallbladder and biliary tree. Pancreas: Unremarkable. Spleen: Unremarkable. Adrenals/Urinary Tract: Normal adrenal glands. No urinary calculi or hydronephrosis. Unremarkable bladder. Stomach/Bowel: Normal caliber large and small bowel. Colonic diverticulosis without diverticulitis. Normal appendix. Stomach is within normal limits. Vascular/Lymphatic: Mild aortic atherosclerotic calcification. No lymphadenopathy. Reproductive: Unremarkable. Other: No free intraperitoneal fluid or air. Musculoskeletal: No acute fracture. Review of the MIP images confirms the above findings. IMPRESSION: 1. No pulmonary embolism or acute abnormality in the chest. 2. No acute abnormality in the abdomen or pelvis. 3. Hepatic steatosis. Electronically Signed   By: Minerva Fester M.D.   On: 05/12/2023 21:19   CT ABDOMEN PELVIS W CONTRAST  Result Date: 05/12/2023 CLINICAL DATA:  Abdominal  and flank pain, stone suspected. Diffuse right lower chest and upper right abdomen pain. PE suspected. EXAM: CT ANGIOGRAPHY CHEST CT ABDOMEN AND PELVIS WITH CONTRAST TECHNIQUE: Multidetector CT imaging of the chest was performed using the standard protocol during bolus administration of intravenous contrast. Multiplanar CT image reconstructions and MIPs were obtained to evaluate the vascular anatomy. Multidetector CT imaging of the abdomen and pelvis was performed using the standard protocol during bolus administration of intravenous contrast. RADIATION DOSE REDUCTION: This exam was performed according to the departmental dose-optimization program which includes automated exposure control, adjustment of the mA and/or kV according to patient size and/or use of iterative reconstruction technique. CONTRAST:   OMNIPAQUE IOHEXOL 350 MG/ML SOLN COMPARISON:  None Available. FINDINGS: CTA CHEST FINDINGS Cardiovascular: Negative for acute pulmonary embolism. Coronary artery atherosclerotic calcification. No pericardial effusion. No aortic aneurysm. Mediastinum/Nodes: Trachea and esophagus are unremarkable. No thoracic adenopathy. Lungs/Pleura: Lungs are clear. No pleural effusion or pneumothorax. Musculoskeletal: No acute abnormality. Review of the MIP images confirms the above findings. CT ABDOMEN and PELVIS FINDINGS Hepatobiliary: Hepatic steatosis. Unremarkable gallbladder and biliary tree. Pancreas: Unremarkable. Spleen: Unremarkable. Adrenals/Urinary Tract: Normal adrenal glands. No urinary calculi or hydronephrosis. Unremarkable bladder. Stomach/Bowel: Normal caliber large and small bowel. Colonic diverticulosis without diverticulitis. Normal appendix. Stomach is within normal limits. Vascular/Lymphatic: Mild aortic atherosclerotic calcification. No lymphadenopathy. Reproductive: Unremarkable. Other: No free intraperitoneal fluid or air. Musculoskeletal: No acute fracture. Review of the MIP images confirms the above findings. IMPRESSION: 1. No pulmonary embolism or acute abnormality in the chest. 2. No acute abnormality in the abdomen or pelvis. 3. Hepatic steatosis. Electronically Signed   By: Minerva Fester M.D.   On: 05/12/2023 21:19    Procedures Procedures    Medications Ordered in ED Medications  iohexol (OMNIPAQUE) 350 MG/ML injection 100 mL (100 mLs Intravenous Contrast Given 05/12/23 2031)    ED Course/ Medical Decision Making/ A&P   62 year old here for evaluation of diffuse right side pain.  Has difficult time describing.  No associated symptoms.  No chest pain, shortness of breath, nausea, vomiting, diarrhea.  Symptoms not worse with eating or drinking.  No clinical evidence of VTE on exam.  No rash to suggest shingles.  No prior abdominal surgeries.  Diffusely tender to right lower chest wall,  upper abdomen as well as flank.  No urinary symptoms, hematuria, history of stones.  Will plan on labs, imaging, reassess.  Patient denies need for pain medication at this time  Labs and imaging personally viewed and interpreted:  CBC without leukocytosis metabolic panel without significant abnormality UA negative for infection, no blood Lipase 31 CTA chest, abdomen pelvis did not show significant abnormality  Patient reassessed.  We discussed his labs and imaging.  States his pain, with movement.  Will treat as MSK.  I discussed signs symptoms do not improve or these begin to worsen when he eats or drinks he should follow-up with primary care provider to order a HIDA scan given his CT scan does not show a significant abnormality to his gallbladder.  He did discuss hepatic steatosis.  Will have him follow-up outpatient, return for any worsening symptoms.  We also discussed possibility of shingles, no current rash.  The patient has been appropriately medically screened and/or stabilized in the ED. I have low suspicion for any other emergent medical condition which would require further screening, evaluation or treatment in the ED or require inpatient management.  Patient is hemodynamically stable and in no acute distress.  Patient able to  ambulate in department prior to ED.  Evaluation does not show acute pathology that would require ongoing or additional emergent interventions while in the emergency department or further inpatient treatment.  I have discussed the diagnosis with the patient and answered all questions.  Pain is been managed while in the emergency department and patient has no further complaints prior to discharge.  Patient is comfortable with plan discussed in room and is stable for discharge at this time.  I have discussed strict return precautions for returning to the emergency department.  Patient was encouraged to follow-up with PCP/specialist refer to at discharge.                                  Medical Decision Making Amount and/or Complexity of Data Reviewed Independent Historian: spouse External Data Reviewed: labs, radiology and notes. Labs: ordered. Decision-making details documented in ED Course. Radiology: ordered and independent interpretation performed. Decision-making details documented in ED Course.  Risk OTC drugs. Prescription drug management. Decision regarding hospitalization. Diagnosis or treatment significantly limited by social determinants of health.          Final Clinical Impression(s) / ED Diagnoses Final diagnoses:  Flank pain    Rx / DC Orders ED Discharge Orders          Ordered    lidocaine (LIDODERM) 5 %  Every 24 hours        05/12/23 2249    methocarbamol (ROBAXIN) 500 MG tablet  2 times daily        05/12/23 2249              Tadarius Maland A, PA-C 05/12/23 2253    Loetta Rough, MD 05/12/23 2312

## 2023-05-12 NOTE — Discharge Instructions (Signed)
It was a pleasure taking care of you in the ED today  Your labs and imaging does not show significant findings  We are treating you with muscle relaxers and lidoderm patches. Use as prescribed  If your symptoms not improve make sure to follow-up with your primary care provider to see if you need to order a HIDA scan related to your gallbladder  Return for any worsening symptoms

## 2023-05-12 NOTE — ED Triage Notes (Signed)
Pt reports right sided pain since Thursday. Pain radiates to right side. Pain is intermittent. Denies N/V/D
